# Patient Record
Sex: Male | Born: 1955 | Hispanic: No | Marital: Married | Smoking: Current every day smoker
Health system: Southern US, Community
[De-identification: ages and names within clinical notes are randomized; demographics above are authoritative.]

## PROBLEM LIST (undated history)

## (undated) DIAGNOSIS — E119 Type 2 diabetes mellitus without complications: Secondary | ICD-10-CM

## (undated) DIAGNOSIS — E7800 Pure hypercholesterolemia, unspecified: Secondary | ICD-10-CM

## (undated) HISTORY — PX: RETINAL DETACHMENT SURGERY: SHX105

---

## 2002-10-20 ENCOUNTER — Emergency Department (HOSPITAL_COMMUNITY): Admission: EM | Admit: 2002-10-20 | Discharge: 2002-10-20 | Payer: Self-pay

## 2006-06-06 ENCOUNTER — Emergency Department (HOSPITAL_COMMUNITY): Admission: EM | Admit: 2006-06-06 | Discharge: 2006-06-06 | Payer: Self-pay | Admitting: Emergency Medicine

## 2012-09-18 ENCOUNTER — Emergency Department: Payer: Self-pay | Admitting: Emergency Medicine

## 2012-09-20 ENCOUNTER — Emergency Department: Payer: Self-pay | Admitting: Emergency Medicine

## 2012-09-20 LAB — BASIC METABOLIC PANEL
Anion Gap: 3 — ABNORMAL LOW (ref 7–16)
BUN: 16 mg/dL (ref 7–18)
Calcium, Total: 8.9 mg/dL (ref 8.5–10.1)
Chloride: 108 mmol/L — ABNORMAL HIGH (ref 98–107)
Co2: 30 mmol/L (ref 21–32)
Creatinine: 0.85 mg/dL (ref 0.60–1.30)
EGFR (African American): >60
EGFR (Non-African Amer.): >60
Glucose: 100 mg/dL — ABNORMAL HIGH (ref 65–99)
Osmolality: 283 (ref 275–301)
Potassium: 4.2 mmol/L (ref 3.5–5.1)
Sodium: 141 mmol/L (ref 136–145)

## 2012-09-20 LAB — CBC WITH DIFFERENTIAL/PLATELET
Basophil #: 0.1 10*3/uL (ref 0.0–0.1)
Basophil %: 1 %
Eosinophil #: 0.1 10*3/uL (ref 0.0–0.7)
Eosinophil %: 1.4 %
HCT: 44.1 % (ref 40.0–52.0)
HGB: 15 g/dL (ref 13.0–18.0)
Lymphocyte #: 1.7 10*3/uL (ref 1.0–3.6)
Lymphocyte %: 22.3 %
MCH: 31.9 pg (ref 26.0–34.0)
MCHC: 34 g/dL (ref 32.0–36.0)
MCV: 94 fL (ref 80–100)
Monocyte #: 0.4 x10 3/mm (ref 0.2–1.0)
Monocyte %: 6 %
Neutrophil #: 5.2 10*3/uL (ref 1.4–6.5)
Neutrophil %: 69.3 %
Platelet: 134 10*3/uL — ABNORMAL LOW (ref 150–440)
RBC: 4.7 10*6/uL (ref 4.40–5.90)
RDW: 13.1 % (ref 11.5–14.5)
WBC: 7.5 10*3/uL (ref 3.8–10.6)

## 2012-09-20 LAB — URINALYSIS, COMPLETE
Bacteria: NONE SEEN
Bilirubin,UR: NEGATIVE
Blood: NEGATIVE
Glucose,UR: NEGATIVE mg/dL (ref 0–75)
Ketone: NEGATIVE
Leukocyte Esterase: NEGATIVE
Nitrite: NEGATIVE
Ph: 7 (ref 4.5–8.0)
Protein: NEGATIVE
RBC,UR: 1 /HPF (ref 0–5)
Specific Gravity: 1.006 (ref 1.003–1.030)
Squamous Epithelial: NONE SEEN
WBC UR: <1 /HPF (ref 0–5)

## 2012-12-13 ENCOUNTER — Ambulatory Visit (INDEPENDENT_AMBULATORY_CARE_PROVIDER_SITE_OTHER): Payer: PRIVATE HEALTH INSURANCE | Admitting: Family Medicine

## 2012-12-13 ENCOUNTER — Telehealth: Payer: Self-pay

## 2012-12-13 VITALS — BP 118/71 | HR 64 | Temp 97.8°F | Resp 18 | Ht 68.5 in | Wt 168.2 lb

## 2012-12-13 DIAGNOSIS — IMO0002 Reserved for concepts with insufficient information to code with codable children: Secondary | ICD-10-CM

## 2012-12-13 DIAGNOSIS — H5461 Unqualified visual loss, right eye, normal vision left eye: Secondary | ICD-10-CM

## 2012-12-13 DIAGNOSIS — H0012 Chalazion right lower eyelid: Secondary | ICD-10-CM

## 2012-12-13 DIAGNOSIS — M6740 Ganglion, unspecified site: Secondary | ICD-10-CM

## 2012-12-13 DIAGNOSIS — H5460 Unqualified visual loss, one eye, unspecified: Secondary | ICD-10-CM

## 2012-12-13 DIAGNOSIS — H0019 Chalazion unspecified eye, unspecified eyelid: Secondary | ICD-10-CM

## 2012-12-13 MED ORDER — BACITRACIN-POLYMYXIN B 500-10000 UNIT/GM OP OINT: TOPICAL_OINTMENT | Freq: Four times a day (QID) | OPHTHALMIC | Status: DC

## 2012-12-13 NOTE — Telephone Encounter (Signed)
Pt verifying which pharmacy medication was sent to

## 2012-12-13 NOTE — Progress Notes (Signed)
Subjective:    Patient ID: Greg Russell, male    DOB: 1956-04-09, 57 y.o.   MRN: [id number] Chief Complaint  Patient presents with  . Eye Problem    rt eye has ? cyst  also something on thumb    HPI  Speaks [place] - his son translates.  For the past 2 wks, he has had increasing pain and scratchiness on his right lower eyelid and now a bump is formed there.  Has not drained anything, has not tried anything. Has never had anything similar.  Many yrs ago in [place], he got fiberglass into his right eye and progressively lost his vision. 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 Also, his right iris changed color to blue. He has never seen an eye doctor about this and wonders if there is any chance he could get some of his vision back.  History reviewed. No pertinent past medical history. No current outpatient prescriptions on file prior to visit.   No current facility-administered medications on file prior to visit.   No Known Allergies   Review of Systems  Constitutional: Negative for fever, chills, diaphoresis and fatigue.  HENT: Negative for ear pain, congestion, sore throat, rhinorrhea, sneezing, postnasal drip, sinus pressure and ear discharge.   Eyes: Positive for pain, itching and visual disturbance. Negative for photophobia, discharge and redness.  Skin: Negative for rash.  Hematological: Negative for adenopathy.       BP 118/71  Pulse 64  Temp(Src) 97.8 F (36.6 C) (Oral)  Resp 18  Ht 5' 8.5" (1.74 m)  Wt 168 lb 3.2 oz (76.295 kg)  BMI 25.2 kg/m2  SpO2 98% Objective:   Physical Exam  Constitutional: He is oriented to person, place, and time. He appears well-developed and well-nourished. No distress.  HENT:  Head: Normocephalic and atraumatic.  Eyes: Conjunctivae and EOM are normal. Pupils are equal, round, and reactive to light. Right eye exhibits hordeolum. Right eye exhibits no chemosis, no discharge and no exudate. Right  conjunctiva is not injected. Right conjunctiva has no hemorrhage. No scleral icterus. Right pupil is round and reactive. Left pupil is round and reactive. Pupils are equal.    Right iris silvery blue  Pulmonary/Chest: Effort normal.  Musculoskeletal:       Left hand: He exhibits normal range of motion, no tenderness and no bony tenderness. Normal sensation noted. Normal strength noted.       Hands: Mobile firm well defined subcutaneous nodule sev mm dm near left first IP joint. No restrictions or pain to IP joint.  Neurological: He is alert and oriented to person, place, and time.  Skin: Skin is warm and dry. He is not diaphoretic.  Psychiatric: He has a normal mood and affect. His behavior is normal.      Assessment & Plan:  Chalazion of right lower eyelid - warm compresses followed by top abx ointment sev times daily until resolved.  Vision loss of right eye - Plan: Ambulatory referral to Ophthalmology - Pt informed that due to extended period since intial injury, I doubt that these is any chance that he will be able to regain vision.  However, this makes it even more important that he retain his vision in his left eye and due to his age he is long overdue for eye eval so will refer to optho.  Cyst in hand - on left first IP joint - benign - watchful waiting for now.  Meds ordered this  encounter  Medications  . bacitracin-polymyxin b (POLYSPORIN) ophthalmic ointment    Sig: Place into the right eye 4 (four) times daily. apply to eye every 12 hours while awake    Dispense:  3.5 g    Refill:  0

## 2012-12-13 NOTE — Patient Instructions (Addendum)
Chalazion A chalazion is a swelling or hard lump on the eyelid caused by a blocked oil gland. Chalazions may occur on the upper or the lower eyelid.  CAUSES  Oil gland in the eyelid becomes blocked. SYMPTOMS   Swelling or hard lump on the eyelid. This lump may make it hard to see out of the eye.  The swelling may spread to areas around the eye. TREATMENT   Although some chalazions disappear by themselves in 1 or 2 months, some chalazions may need to be removed.  Medicines to treat an infection may be required. HOME CARE INSTRUCTIONS   Wash your hands often and dry them with a clean towel. Do not touch the chalazion.  Apply heat to the eyelid several times a day for 10 minutes to help ease discomfort and bring any yellowish white fluid (pus) to the surface. One way to apply heat to a chalazion is to use the handle of a metal spoon.  Hold the handle under hot water until it is hot, and then wrap the handle in paper towels so that the heat can come through without burning your skin.  Hold the wrapped handle against the chalazion and reheat the spoon handle as needed.  Apply heat in this fashion for 10 minutes, 4 times per day.  Return to your caregiver to have the pus removed if it does not break (rupture) on its own.  Do not try to remove the pus yourself by squeezing the chalazion or sticking it with a pin or needle.  Only take over-the-counter or prescription medicines for pain, discomfort, or fever as directed by your caregiver. SEEK IMMEDIATE MEDICAL CARE IF:   You have pain in your eye.  Your vision changes.  The chalazion does not go away.  The chalazion becomes painful, red, or swollen, grows larger, or does not start to disappear after 2 weeks. MAKE SURE YOU:   Understand these instructions.  Will watch your condition.  Will get help right away if you are not doing well or get worse. Document Released: 09/06/2000 Document Revised: 12/02/2011 Document Reviewed:  12/25/2009 [facility] Patient Information 2013 [place], [place].

## 2012-12-24 ENCOUNTER — Ambulatory Visit (INDEPENDENT_AMBULATORY_CARE_PROVIDER_SITE_OTHER): Payer: PRIVATE HEALTH INSURANCE | Admitting: Physician Assistant

## 2012-12-24 VITALS — BP 122/69 | HR 76 | Temp 97.6°F | Resp 18 | Ht 62.5 in | Wt 172.4 lb

## 2012-12-24 DIAGNOSIS — K122 Cellulitis and abscess of mouth: Secondary | ICD-10-CM

## 2012-12-24 MED ORDER — TRAMADOL HCL 50 MG PO TABS: ORAL_TABLET | ORAL | Status: DC

## 2012-12-24 MED ORDER — NAPROXEN 500 MG PO TABS: 500.0000 mg | ORAL_TABLET | Freq: Two times a day (BID) | ORAL | Status: DC

## 2012-12-24 MED ORDER — PENICILLIN V POTASSIUM 500 MG PO TABS: 500.0000 mg | ORAL_TABLET | Freq: Three times a day (TID) | ORAL | Status: DC

## 2012-12-24 NOTE — Progress Notes (Signed)
  Subjective:    Patient ID: Greg Russell, male    DOB: [birth year], 57 y.o.   MRN: [id number]  Dental Pain    57 yr old [place]ian male presents with lower tooth pain that started today. He doesn't have a dentist.  His daughter is here translating. No f/c.  The patient thinks he has a crown on the tooth that is hurting and noticed it being loose ~1 month ago. He took 4 ibuprofen about 3 hours ago that has given some relief.  He takes tramadol for a back problem, but he hasn't taken any of that today. The tooth is also sensitive to heat but soothed by cold.  He denies any health problems.  Review of Systems  All other systems reviewed and are negative.      Objective:   Physical Exam  Nursing note and vitals reviewed. Constitutional: He is oriented to person, place, and time. He appears well-developed and well-nourished.  HENT:  Head: Normocephalic and atraumatic.  Mouth/Throat: No oropharyngeal exudate.  Very poor dentition generally.  Bridge present over Left lower back teeth. Tooth #22 lower Left is slightly loose and very tender with manipulation.  There is swelling at the gum and erythema. Appears to have extensive gum disease. No submental or submandibular nodes.  Cardiovascular: Normal rate and regular rhythm.   Pulmonary/Chest: Effort normal and breath sounds normal.  Neurological: He is alert and oriented to person, place, and time.  Skin: Skin is warm and dry.  Psychiatric: He has a normal mood and affect. His behavior is normal.       Assessment & Plan:  Tooth disease, loose tooth with likely infection and extensive gum disease. MUST SEE DENTIST ASAP!!!! Salt water gargles. Meds ordered this encounter  Medications  . DISCONTD: traMADol (ULTRAM) 50 MG tablet    Sig: Take 50 mg by mouth every 6 (six) hours as needed for pain.  . naproxen (NAPROSYN) 500 MG tablet    Sig: Take 1 tablet (500 mg total) by mouth 2 (two) times daily with a meal. X 1week then prn pain   Dispense:  60 tablet    Refill:  0    Order Specific Question:  Supervising Provider    Answer:  DOOLITTLE, ROBERT P [[id number]]  . traMADol (ULTRAM) 50 MG tablet    Sig: 1 every 6 hours in addition to naprosyn if needed for pain    Dispense:  60 tablet    Refill:  0    Order Specific Question:  Supervising Provider    Answer:  DOOLITTLE, ROBERT P [[id number]]  . penicillin v potassium (VEETID) 500 MG tablet    Sig: Take 1 tablet (500 mg total) by mouth 3 (three) times daily.    Dispense:  30 tablet    Refill:  0    Order Specific Question:  Supervising Provider    Answer:  DOOLITTLE, ROBERT P [[id number]]

## 2013-01-15 ENCOUNTER — Ambulatory Visit (INDEPENDENT_AMBULATORY_CARE_PROVIDER_SITE_OTHER): Payer: PRIVATE HEALTH INSURANCE | Admitting: Family Medicine

## 2013-01-15 VITALS — BP 109/72 | HR 76 | Temp 98.5°F | Resp 16 | Ht 68.0 in | Wt 169.0 lb

## 2013-01-15 DIAGNOSIS — L0201 Cutaneous abscess of face: Secondary | ICD-10-CM

## 2013-01-15 DIAGNOSIS — L03211 Cellulitis of face: Secondary | ICD-10-CM

## 2013-01-15 MED ORDER — DOXYCYCLINE HYCLATE 100 MG PO TABS: 100.0000 mg | ORAL_TABLET | Freq: Two times a day (BID) | ORAL | Status: DC

## 2013-01-15 NOTE — Progress Notes (Signed)
57 yo man with right cheek induration and tenderness.    Objective:   Red, indurated 1/2 cm area on right cheek  Assessment:  Cellulitis   Cellulitis and abscess of face - Plan: doxycycline (VIBRA-TABS) 100 MG tablet

## 2013-03-16 ENCOUNTER — Other Ambulatory Visit (HOSPITAL_COMMUNITY): Payer: Self-pay | Admitting: Neurosurgery

## 2013-03-16 DIAGNOSIS — M4306 Spondylolysis, lumbar region: Secondary | ICD-10-CM

## 2013-04-15 ENCOUNTER — Inpatient Hospital Stay (HOSPITAL_COMMUNITY): Admission: RE | Admit: 2013-04-15 | Payer: Self-pay | Source: Ambulatory Visit

## 2013-04-15 ENCOUNTER — Ambulatory Visit (HOSPITAL_COMMUNITY): Payer: Worker's Compensation

## 2013-04-30 ENCOUNTER — Other Ambulatory Visit: Payer: Self-pay | Admitting: Neurosurgery

## 2013-05-03 ENCOUNTER — Other Ambulatory Visit (HOSPITAL_COMMUNITY): Payer: Self-pay | Admitting: Neurosurgery

## 2013-05-03 ENCOUNTER — Encounter (HOSPITAL_COMMUNITY): Payer: Self-pay | Admitting: Pharmacy Technician

## 2013-05-03 ENCOUNTER — Ambulatory Visit (HOSPITAL_COMMUNITY)
Admission: RE | Admit: 2013-05-03 | Discharge: 2013-05-03 | Disposition: A | Discharge status: Home / Self Care | Payer: Worker's Compensation | Source: Ambulatory Visit | Attending: Neurosurgery | Admitting: Neurosurgery

## 2013-05-03 ENCOUNTER — Encounter (HOSPITAL_COMMUNITY): Payer: Self-pay

## 2013-05-03 DIAGNOSIS — M5137 Other intervertebral disc degeneration, lumbosacral region: Secondary | ICD-10-CM | POA: Insufficient documentation

## 2013-05-03 DIAGNOSIS — M5020 Other cervical disc displacement, unspecified cervical region: Secondary | ICD-10-CM | POA: Insufficient documentation

## 2013-05-03 DIAGNOSIS — M542 Cervicalgia: Secondary | ICD-10-CM | POA: Insufficient documentation

## 2013-05-03 DIAGNOSIS — M4306 Spondylolysis, lumbar region: Secondary | ICD-10-CM

## 2013-05-03 DIAGNOSIS — R52 Pain, unspecified: Secondary | ICD-10-CM

## 2013-05-03 DIAGNOSIS — M5030 Other cervical disc degeneration, unspecified cervical region: Secondary | ICD-10-CM | POA: Insufficient documentation

## 2013-05-03 DIAGNOSIS — M79609 Pain in unspecified limb: Secondary | ICD-10-CM | POA: Insufficient documentation

## 2013-05-03 DIAGNOSIS — M545 Low back pain, unspecified: Secondary | ICD-10-CM | POA: Insufficient documentation

## 2013-05-03 DIAGNOSIS — M51379 Other intervertebral disc degeneration, lumbosacral region without mention of lumbar back pain or lower extremity pain: Secondary | ICD-10-CM | POA: Insufficient documentation

## 2013-05-03 DIAGNOSIS — M47817 Spondylosis without myelopathy or radiculopathy, lumbosacral region: Secondary | ICD-10-CM | POA: Insufficient documentation

## 2013-05-03 MED ORDER — OXYCODONE HCL 5 MG PO TABS
5.0000 mg | ORAL_TABLET | ORAL | Status: DC | PRN
  Administered 2013-05-03: 10 mg via ORAL

## 2013-05-03 MED ORDER — IOHEXOL 180 MG/ML  SOLN: 20.0000 mL | Freq: Once | INTRAMUSCULAR | Status: DC | PRN

## 2013-05-03 MED ORDER — ONDANSETRON HCL 4 MG/2ML IJ SOLN: 4.0000 mg | Freq: Four times a day (QID) | INTRAMUSCULAR | Status: DC | PRN

## 2013-05-03 MED ORDER — DIAZEPAM 5 MG PO TABS
ORAL_TABLET | ORAL | Status: AC
  Administered 2013-05-03: 10 mg
  Filled 2013-05-03: qty 2

## 2013-05-03 MED ORDER — OXYCODONE HCL 5 MG PO TABS
ORAL_TABLET | ORAL | Status: AC
  Filled 2013-05-03: qty 2

## 2013-05-03 MED ORDER — DIAZEPAM 5 MG PO TABS: 10.0000 mg | ORAL_TABLET | Freq: Once | ORAL | Status: DC

## 2013-05-03 MED ORDER — IOHEXOL 300 MG/ML  SOLN
10.0000 mL | Freq: Once | INTRAMUSCULAR | Status: AC | PRN
  Administered 2013-05-03: 10 mL via INTRATHECAL

## 2013-05-03 NOTE — Op Note (Signed)
*   No surgery found * Lumbar Myelogram  PATIENT:  Greg Russell is a 57 y.o. male with pain in the lower back, and right upper extremity pain.  PRE-OPERATIVE DIAGNOSIS:  Lumbago, cervicalgia  POST-OPERATIVE DIAGNOSIS:  Lumbago, cervicalgia  PROCEDURE:  Lumbar Myelogram  SURGEON:  Dwyane Dupree  ANESTHESIA:   local LOCAL MEDICATIONS USED:  LIDOCAINE  and Amount: 6cc ml Procedure Note: With fluoroscopic guidance I placed a needle into the disc space at L3/4. I infiltrated 10cc 300 omnipaque into the thecal sac. This was done without difficulty.   PATIENT DISPOSITION:  PACU - hemodynamically stable.

## 2015-09-29 DIAGNOSIS — M545 Low back pain: Secondary | ICD-10-CM | POA: Diagnosis not present

## 2015-09-29 DIAGNOSIS — H3320 Serous retinal detachment, unspecified eye: Secondary | ICD-10-CM | POA: Diagnosis not present

## 2015-09-29 DIAGNOSIS — H269 Unspecified cataract: Secondary | ICD-10-CM | POA: Diagnosis not present

## 2015-09-29 DIAGNOSIS — Z0000 Encounter for general adult medical examination without abnormal findings: Secondary | ICD-10-CM | POA: Diagnosis not present

## 2015-10-03 DIAGNOSIS — F17210 Nicotine dependence, cigarettes, uncomplicated: Secondary | ICD-10-CM | POA: Diagnosis not present

## 2015-10-03 DIAGNOSIS — E782 Mixed hyperlipidemia: Secondary | ICD-10-CM | POA: Diagnosis not present

## 2015-10-03 DIAGNOSIS — F17200 Nicotine dependence, unspecified, uncomplicated: Secondary | ICD-10-CM | POA: Diagnosis not present

## 2015-10-03 DIAGNOSIS — Z0000 Encounter for general adult medical examination without abnormal findings: Secondary | ICD-10-CM | POA: Diagnosis not present

## 2015-10-23 DIAGNOSIS — E6609 Other obesity due to excess calories: Secondary | ICD-10-CM | POA: Diagnosis not present

## 2015-10-23 DIAGNOSIS — M545 Low back pain: Secondary | ICD-10-CM | POA: Diagnosis not present

## 2015-10-23 DIAGNOSIS — Z1211 Encounter for screening for malignant neoplasm of colon: Secondary | ICD-10-CM | POA: Diagnosis not present

## 2015-11-09 DIAGNOSIS — D120 Benign neoplasm of cecum: Secondary | ICD-10-CM | POA: Diagnosis not present

## 2015-11-09 DIAGNOSIS — K635 Polyp of colon: Secondary | ICD-10-CM | POA: Diagnosis not present

## 2015-11-09 DIAGNOSIS — Z1211 Encounter for screening for malignant neoplasm of colon: Secondary | ICD-10-CM | POA: Diagnosis not present

## 2015-11-28 DIAGNOSIS — E782 Mixed hyperlipidemia: Secondary | ICD-10-CM | POA: Diagnosis not present

## 2015-12-05 DIAGNOSIS — E782 Mixed hyperlipidemia: Secondary | ICD-10-CM | POA: Diagnosis not present

## 2015-12-05 DIAGNOSIS — M545 Low back pain: Secondary | ICD-10-CM | POA: Diagnosis not present

## 2016-01-03 DIAGNOSIS — H1711 Central corneal opacity, right eye: Secondary | ICD-10-CM | POA: Diagnosis not present

## 2016-01-03 DIAGNOSIS — H2701 Aphakia, right eye: Secondary | ICD-10-CM | POA: Diagnosis not present

## 2016-01-03 DIAGNOSIS — T85398D Other mechanical complication of other ocular prosthetic devices, implants and grafts, subsequent encounter: Secondary | ICD-10-CM | POA: Diagnosis not present

## 2016-01-03 DIAGNOSIS — H18231 Secondary corneal edema, right eye: Secondary | ICD-10-CM | POA: Diagnosis not present

## 2016-09-04 DIAGNOSIS — H25012 Cortical age-related cataract, left eye: Secondary | ICD-10-CM | POA: Diagnosis not present

## 2016-09-04 DIAGNOSIS — H54414A Blindness right eye category 4, normal vision left eye: Secondary | ICD-10-CM | POA: Diagnosis not present

## 2016-09-04 DIAGNOSIS — H35362 Drusen (degenerative) of macula, left eye: Secondary | ICD-10-CM | POA: Diagnosis not present

## 2016-09-04 DIAGNOSIS — H338 Other retinal detachments: Secondary | ICD-10-CM | POA: Diagnosis not present

## 2016-09-04 DIAGNOSIS — H2701 Aphakia, right eye: Secondary | ICD-10-CM | POA: Diagnosis not present

## 2016-10-04 DIAGNOSIS — H179 Unspecified corneal scar and opacity: Secondary | ICD-10-CM | POA: Diagnosis not present

## 2016-10-04 DIAGNOSIS — H338 Other retinal detachments: Secondary | ICD-10-CM | POA: Diagnosis not present

## 2016-10-04 DIAGNOSIS — H2701 Aphakia, right eye: Secondary | ICD-10-CM | POA: Diagnosis not present

## 2016-10-04 DIAGNOSIS — H18231 Secondary corneal edema, right eye: Secondary | ICD-10-CM | POA: Diagnosis not present

## 2016-10-04 DIAGNOSIS — H25012 Cortical age-related cataract, left eye: Secondary | ICD-10-CM | POA: Diagnosis not present

## 2016-10-16 DIAGNOSIS — E782 Mixed hyperlipidemia: Secondary | ICD-10-CM | POA: Diagnosis not present

## 2016-10-16 DIAGNOSIS — M545 Low back pain: Secondary | ICD-10-CM | POA: Diagnosis not present

## 2016-10-23 DIAGNOSIS — R5383 Other fatigue: Secondary | ICD-10-CM | POA: Diagnosis not present

## 2016-10-23 DIAGNOSIS — E782 Mixed hyperlipidemia: Secondary | ICD-10-CM | POA: Diagnosis not present

## 2016-10-23 DIAGNOSIS — M545 Low back pain: Secondary | ICD-10-CM | POA: Diagnosis not present

## 2016-10-31 DIAGNOSIS — H2701 Aphakia, right eye: Secondary | ICD-10-CM | POA: Diagnosis not present

## 2016-10-31 DIAGNOSIS — H168 Other keratitis: Secondary | ICD-10-CM | POA: Diagnosis not present

## 2016-10-31 DIAGNOSIS — H18231 Secondary corneal edema, right eye: Secondary | ICD-10-CM | POA: Diagnosis not present

## 2016-10-31 DIAGNOSIS — H3341 Traction detachment of retina, right eye: Secondary | ICD-10-CM | POA: Diagnosis not present

## 2016-10-31 DIAGNOSIS — F17210 Nicotine dependence, cigarettes, uncomplicated: Secondary | ICD-10-CM | POA: Diagnosis not present

## 2016-10-31 DIAGNOSIS — H59011 Keratopathy (bullous aphakic) following cataract surgery, right eye: Secondary | ICD-10-CM | POA: Diagnosis not present

## 2016-10-31 DIAGNOSIS — H3521 Other non-diabetic proliferative retinopathy, right eye: Secondary | ICD-10-CM | POA: Diagnosis not present

## 2016-10-31 DIAGNOSIS — Z8669 Personal history of other diseases of the nervous system and sense organs: Secondary | ICD-10-CM | POA: Diagnosis not present

## 2016-10-31 DIAGNOSIS — H16421 Pannus (corneal), right eye: Secondary | ICD-10-CM | POA: Diagnosis not present

## 2016-10-31 DIAGNOSIS — H3321 Serous retinal detachment, right eye: Secondary | ICD-10-CM | POA: Diagnosis not present

## 2016-11-01 DIAGNOSIS — H2701 Aphakia, right eye: Secondary | ICD-10-CM | POA: Diagnosis not present

## 2016-11-01 DIAGNOSIS — H3341 Traction detachment of retina, right eye: Secondary | ICD-10-CM | POA: Diagnosis not present

## 2016-12-11 DIAGNOSIS — H3521 Other non-diabetic proliferative retinopathy, right eye: Secondary | ICD-10-CM | POA: Diagnosis not present

## 2016-12-11 DIAGNOSIS — H35362 Drusen (degenerative) of macula, left eye: Secondary | ICD-10-CM | POA: Diagnosis not present

## 2016-12-11 DIAGNOSIS — H2701 Aphakia, right eye: Secondary | ICD-10-CM | POA: Diagnosis not present

## 2016-12-11 DIAGNOSIS — H3341 Traction detachment of retina, right eye: Secondary | ICD-10-CM | POA: Diagnosis not present

## 2016-12-11 DIAGNOSIS — H338 Other retinal detachments: Secondary | ICD-10-CM | POA: Diagnosis not present

## 2016-12-24 DIAGNOSIS — E782 Mixed hyperlipidemia: Secondary | ICD-10-CM | POA: Diagnosis not present

## 2016-12-24 DIAGNOSIS — R5383 Other fatigue: Secondary | ICD-10-CM | POA: Diagnosis not present

## 2017-01-01 DIAGNOSIS — E782 Mixed hyperlipidemia: Secondary | ICD-10-CM | POA: Diagnosis not present

## 2017-01-01 DIAGNOSIS — Z87891 Personal history of nicotine dependence: Secondary | ICD-10-CM | POA: Diagnosis not present

## 2017-01-01 DIAGNOSIS — S139XXA Sprain of joints and ligaments of unspecified parts of neck, initial encounter: Secondary | ICD-10-CM | POA: Diagnosis not present

## 2017-01-15 DIAGNOSIS — H338 Other retinal detachments: Secondary | ICD-10-CM | POA: Diagnosis not present

## 2017-01-15 DIAGNOSIS — H2701 Aphakia, right eye: Secondary | ICD-10-CM | POA: Diagnosis not present

## 2017-01-15 DIAGNOSIS — H3341 Traction detachment of retina, right eye: Secondary | ICD-10-CM | POA: Diagnosis not present

## 2017-01-15 DIAGNOSIS — H3521 Other non-diabetic proliferative retinopathy, right eye: Secondary | ICD-10-CM | POA: Diagnosis not present

## 2017-01-15 DIAGNOSIS — H35362 Drusen (degenerative) of macula, left eye: Secondary | ICD-10-CM | POA: Diagnosis not present

## 2017-04-02 DIAGNOSIS — H3521 Other non-diabetic proliferative retinopathy, right eye: Secondary | ICD-10-CM | POA: Diagnosis not present

## 2017-04-02 DIAGNOSIS — Z947 Corneal transplant status: Secondary | ICD-10-CM | POA: Diagnosis not present

## 2017-04-02 DIAGNOSIS — H2701 Aphakia, right eye: Secondary | ICD-10-CM | POA: Diagnosis not present

## 2017-04-02 DIAGNOSIS — H18231 Secondary corneal edema, right eye: Secondary | ICD-10-CM | POA: Diagnosis not present

## 2017-04-02 DIAGNOSIS — H3341 Traction detachment of retina, right eye: Secondary | ICD-10-CM | POA: Diagnosis not present

## 2017-04-10 DIAGNOSIS — Z9889 Other specified postprocedural states: Secondary | ICD-10-CM | POA: Diagnosis not present

## 2017-04-10 DIAGNOSIS — E785 Hyperlipidemia, unspecified: Secondary | ICD-10-CM | POA: Diagnosis not present

## 2017-04-10 DIAGNOSIS — Z947 Corneal transplant status: Secondary | ICD-10-CM | POA: Diagnosis not present

## 2017-04-10 DIAGNOSIS — H33191 Other retinoschisis and retinal cysts, right eye: Secondary | ICD-10-CM | POA: Diagnosis not present

## 2017-04-10 DIAGNOSIS — H3530 Unspecified macular degeneration: Secondary | ICD-10-CM | POA: Diagnosis not present

## 2017-04-10 DIAGNOSIS — H33021 Retinal detachment with multiple breaks, right eye: Secondary | ICD-10-CM | POA: Diagnosis not present

## 2017-04-10 DIAGNOSIS — H31091 Other chorioretinal scars, right eye: Secondary | ICD-10-CM | POA: Diagnosis not present

## 2017-04-10 DIAGNOSIS — H3521 Other non-diabetic proliferative retinopathy, right eye: Secondary | ICD-10-CM | POA: Diagnosis not present

## 2017-04-10 DIAGNOSIS — H35371 Puckering of macula, right eye: Secondary | ICD-10-CM | POA: Diagnosis not present

## 2017-04-10 DIAGNOSIS — F17210 Nicotine dependence, cigarettes, uncomplicated: Secondary | ICD-10-CM | POA: Diagnosis not present

## 2017-04-10 DIAGNOSIS — H2701 Aphakia, right eye: Secondary | ICD-10-CM | POA: Diagnosis not present

## 2017-04-10 DIAGNOSIS — H3341 Traction detachment of retina, right eye: Secondary | ICD-10-CM | POA: Diagnosis not present

## 2017-05-14 DIAGNOSIS — H338 Other retinal detachments: Secondary | ICD-10-CM | POA: Diagnosis not present

## 2017-05-14 DIAGNOSIS — H3521 Other non-diabetic proliferative retinopathy, right eye: Secondary | ICD-10-CM | POA: Diagnosis not present

## 2017-05-14 DIAGNOSIS — H2701 Aphakia, right eye: Secondary | ICD-10-CM | POA: Diagnosis not present

## 2017-05-14 DIAGNOSIS — H35362 Drusen (degenerative) of macula, left eye: Secondary | ICD-10-CM | POA: Diagnosis not present

## 2017-05-14 DIAGNOSIS — H3341 Traction detachment of retina, right eye: Secondary | ICD-10-CM | POA: Diagnosis not present

## 2017-06-04 DIAGNOSIS — Z131 Encounter for screening for diabetes mellitus: Secondary | ICD-10-CM | POA: Diagnosis not present

## 2017-06-04 DIAGNOSIS — E782 Mixed hyperlipidemia: Secondary | ICD-10-CM | POA: Diagnosis not present

## 2017-06-11 DIAGNOSIS — E782 Mixed hyperlipidemia: Secondary | ICD-10-CM | POA: Diagnosis not present

## 2017-07-02 DIAGNOSIS — H3341 Traction detachment of retina, right eye: Secondary | ICD-10-CM | POA: Diagnosis not present

## 2017-08-13 DIAGNOSIS — H35362 Drusen (degenerative) of macula, left eye: Secondary | ICD-10-CM | POA: Diagnosis not present

## 2017-08-13 DIAGNOSIS — H3341 Traction detachment of retina, right eye: Secondary | ICD-10-CM | POA: Diagnosis not present

## 2017-08-13 DIAGNOSIS — Z947 Corneal transplant status: Secondary | ICD-10-CM | POA: Diagnosis not present

## 2017-08-13 DIAGNOSIS — H3521 Other non-diabetic proliferative retinopathy, right eye: Secondary | ICD-10-CM | POA: Diagnosis not present

## 2017-08-13 DIAGNOSIS — H2701 Aphakia, right eye: Secondary | ICD-10-CM | POA: Diagnosis not present

## 2017-08-13 DIAGNOSIS — H179 Unspecified corneal scar and opacity: Secondary | ICD-10-CM | POA: Diagnosis not present

## 2017-08-25 DIAGNOSIS — E782 Mixed hyperlipidemia: Secondary | ICD-10-CM | POA: Diagnosis not present

## 2017-08-27 DIAGNOSIS — E782 Mixed hyperlipidemia: Secondary | ICD-10-CM | POA: Diagnosis not present

## 2017-10-15 DIAGNOSIS — H18231 Secondary corneal edema, right eye: Secondary | ICD-10-CM | POA: Diagnosis not present

## 2017-10-15 DIAGNOSIS — H3521 Other non-diabetic proliferative retinopathy, right eye: Secondary | ICD-10-CM | POA: Diagnosis not present

## 2017-10-15 DIAGNOSIS — Z947 Corneal transplant status: Secondary | ICD-10-CM | POA: Diagnosis not present

## 2018-05-28 DIAGNOSIS — Z947 Corneal transplant status: Secondary | ICD-10-CM | POA: Diagnosis not present

## 2018-05-28 DIAGNOSIS — H5711 Ocular pain, right eye: Secondary | ICD-10-CM | POA: Diagnosis not present

## 2018-06-09 DIAGNOSIS — E782 Mixed hyperlipidemia: Secondary | ICD-10-CM | POA: Diagnosis not present

## 2018-06-09 DIAGNOSIS — R5383 Other fatigue: Secondary | ICD-10-CM | POA: Diagnosis not present

## 2018-06-09 DIAGNOSIS — Z79899 Other long term (current) drug therapy: Secondary | ICD-10-CM | POA: Diagnosis not present

## 2018-06-15 DIAGNOSIS — Z0000 Encounter for general adult medical examination without abnormal findings: Secondary | ICD-10-CM | POA: Diagnosis not present

## 2018-06-15 DIAGNOSIS — R5383 Other fatigue: Secondary | ICD-10-CM | POA: Diagnosis not present

## 2018-06-15 DIAGNOSIS — E782 Mixed hyperlipidemia: Secondary | ICD-10-CM | POA: Diagnosis not present

## 2018-06-15 DIAGNOSIS — Z23 Encounter for immunization: Secondary | ICD-10-CM | POA: Diagnosis not present

## 2018-06-15 DIAGNOSIS — M545 Low back pain: Secondary | ICD-10-CM | POA: Diagnosis not present

## 2018-06-26 DIAGNOSIS — H2701 Aphakia, right eye: Secondary | ICD-10-CM | POA: Diagnosis not present

## 2018-06-26 DIAGNOSIS — H18231 Secondary corneal edema, right eye: Secondary | ICD-10-CM | POA: Diagnosis not present

## 2018-07-29 DIAGNOSIS — H179 Unspecified corneal scar and opacity: Secondary | ICD-10-CM | POA: Diagnosis not present

## 2018-07-29 DIAGNOSIS — H3521 Other non-diabetic proliferative retinopathy, right eye: Secondary | ICD-10-CM | POA: Diagnosis not present

## 2018-07-29 DIAGNOSIS — H3341 Traction detachment of retina, right eye: Secondary | ICD-10-CM | POA: Diagnosis not present

## 2018-07-29 DIAGNOSIS — H5711 Ocular pain, right eye: Secondary | ICD-10-CM | POA: Diagnosis not present

## 2018-07-29 DIAGNOSIS — H2701 Aphakia, right eye: Secondary | ICD-10-CM | POA: Diagnosis not present

## 2018-07-29 DIAGNOSIS — H35362 Drusen (degenerative) of macula, left eye: Secondary | ICD-10-CM | POA: Diagnosis not present

## 2018-07-29 DIAGNOSIS — Z947 Corneal transplant status: Secondary | ICD-10-CM | POA: Diagnosis not present

## 2018-12-10 DIAGNOSIS — R5383 Other fatigue: Secondary | ICD-10-CM | POA: Diagnosis not present

## 2018-12-10 DIAGNOSIS — E782 Mixed hyperlipidemia: Secondary | ICD-10-CM | POA: Diagnosis not present

## 2018-12-10 DIAGNOSIS — Z131 Encounter for screening for diabetes mellitus: Secondary | ICD-10-CM | POA: Diagnosis not present

## 2018-12-10 DIAGNOSIS — Z79899 Other long term (current) drug therapy: Secondary | ICD-10-CM | POA: Diagnosis not present

## 2019-03-12 DIAGNOSIS — E782 Mixed hyperlipidemia: Secondary | ICD-10-CM | POA: Diagnosis not present

## 2019-03-24 DIAGNOSIS — H179 Unspecified corneal scar and opacity: Secondary | ICD-10-CM | POA: Diagnosis not present

## 2019-03-24 DIAGNOSIS — H3521 Other non-diabetic proliferative retinopathy, right eye: Secondary | ICD-10-CM | POA: Diagnosis not present

## 2019-03-24 DIAGNOSIS — H2701 Aphakia, right eye: Secondary | ICD-10-CM | POA: Diagnosis not present

## 2019-03-24 DIAGNOSIS — H3341 Traction detachment of retina, right eye: Secondary | ICD-10-CM | POA: Diagnosis not present

## 2019-03-24 DIAGNOSIS — H5711 Ocular pain, right eye: Secondary | ICD-10-CM | POA: Diagnosis not present

## 2019-03-24 DIAGNOSIS — H18231 Secondary corneal edema, right eye: Secondary | ICD-10-CM | POA: Diagnosis not present

## 2019-03-24 DIAGNOSIS — H35362 Drusen (degenerative) of macula, left eye: Secondary | ICD-10-CM | POA: Diagnosis not present

## 2019-03-24 DIAGNOSIS — Z947 Corneal transplant status: Secondary | ICD-10-CM | POA: Diagnosis not present

## 2019-03-25 DIAGNOSIS — Z7189 Other specified counseling: Secondary | ICD-10-CM | POA: Diagnosis not present

## 2019-03-25 DIAGNOSIS — E782 Mixed hyperlipidemia: Secondary | ICD-10-CM | POA: Diagnosis not present

## 2019-03-25 DIAGNOSIS — R7303 Prediabetes: Secondary | ICD-10-CM | POA: Diagnosis not present

## 2019-03-25 DIAGNOSIS — R5383 Other fatigue: Secondary | ICD-10-CM | POA: Diagnosis not present

## 2019-04-01 ENCOUNTER — Other Ambulatory Visit: Payer: Self-pay

## 2019-04-01 ENCOUNTER — Encounter (HOSPITAL_BASED_OUTPATIENT_CLINIC_OR_DEPARTMENT_OTHER): Payer: Self-pay | Admitting: Emergency Medicine

## 2019-04-01 ENCOUNTER — Emergency Department (HOSPITAL_BASED_OUTPATIENT_CLINIC_OR_DEPARTMENT_OTHER)
Admission: EM | Admit: 2019-04-01 | Discharge: 2019-04-01 | Disposition: A | Discharge status: Home / Self Care | Payer: PPO | Attending: Emergency Medicine | Admitting: Emergency Medicine

## 2019-04-01 DIAGNOSIS — K130 Diseases of lips: Secondary | ICD-10-CM | POA: Insufficient documentation

## 2019-04-01 DIAGNOSIS — E119 Type 2 diabetes mellitus without complications: Secondary | ICD-10-CM | POA: Diagnosis not present

## 2019-04-01 DIAGNOSIS — F17210 Nicotine dependence, cigarettes, uncomplicated: Secondary | ICD-10-CM | POA: Diagnosis not present

## 2019-04-01 DIAGNOSIS — R220 Localized swelling, mass and lump, head: Secondary | ICD-10-CM

## 2019-04-01 HISTORY — DX: Pure hypercholesterolemia, unspecified: E78.00

## 2019-04-01 HISTORY — DX: Type 2 diabetes mellitus without complications: E11.9

## 2019-04-01 LAB — COMPREHENSIVE METABOLIC PANEL
ALT: 19 U/L (ref 0–44)
AST: 20 U/L (ref 15–41)
Albumin: 4.2 g/dL (ref 3.5–5.0)
Alkaline Phosphatase: 64 U/L (ref 38–126)
Anion gap: 8 (ref 5–15)
BUN: 18 mg/dL (ref 8–23)
CO2: 26 mmol/L (ref 22–32)
Calcium: 8.8 mg/dL — ABNORMAL LOW (ref 8.9–10.3)
Chloride: 106 mmol/L (ref 98–111)
Creatinine, Ser: 1.03 mg/dL (ref 0.61–1.24)
GFR calc Af Amer: >60 mL/min (ref >60)
GFR calc non Af Amer: >60 mL/min (ref >60)
Glucose, Bld: 118 mg/dL — ABNORMAL HIGH (ref 70–99)
Potassium: 3.9 mmol/L (ref 3.5–5.1)
Sodium: 140 mmol/L (ref 135–145)
Total Bilirubin: 1.3 mg/dL — ABNORMAL HIGH (ref 0.3–1.2)
Total Protein: 6.8 g/dL (ref 6.5–8.1)

## 2019-04-01 LAB — CBC WITH DIFFERENTIAL/PLATELET
Abs Immature Granulocytes: 0.02 10*3/uL (ref 0.00–0.07)
Basophils Absolute: 0.1 10*3/uL (ref 0.0–0.1)
Basophils Relative: 1 %
Eosinophils Absolute: 0.2 10*3/uL (ref 0.0–0.5)
Eosinophils Relative: 3 %
HCT: 43.8 % (ref 39.0–52.0)
Hemoglobin: 14.2 g/dL (ref 13.0–17.0)
Immature Granulocytes: 0 %
Lymphocytes Relative: 24 %
Lymphs Abs: 1.7 10*3/uL (ref 0.7–4.0)
MCH: 30.5 pg (ref 26.0–34.0)
MCHC: 32.4 g/dL (ref 30.0–36.0)
MCV: 94 fL (ref 80.0–100.0)
Monocytes Absolute: 0.7 10*3/uL (ref 0.1–1.0)
Monocytes Relative: 9 %
Neutro Abs: 4.6 10*3/uL (ref 1.7–7.7)
Neutrophils Relative %: 63 %
Platelets: 160 10*3/uL (ref 150–400)
RBC: 4.66 MIL/uL (ref 4.22–5.81)
RDW: 12.4 % (ref 11.5–15.5)
WBC: 7.3 10*3/uL (ref 4.0–10.5)
nRBC: 0 % (ref 0.0–0.2)

## 2019-04-01 MED ORDER — CEPHALEXIN 500 MG PO CAPS: 500.0000 mg | ORAL_CAPSULE | Freq: Two times a day (BID) | ORAL | 0 refills | Status: DC

## 2019-04-01 NOTE — ED Notes (Signed)
Pt. Daughter at bedside able to translate to Pt. And for the Pt.    RN gave Pt. Daughter discharge instructions and MY Chart information.

## 2019-04-01 NOTE — ED Notes (Signed)
Pt. Lip edema still the same as on arrival with no trouble breathing and no drooling noted.

## 2019-04-01 NOTE — Discharge Instructions (Signed)
May take Benadryl as needed for swelling.  Stop taking the chokeberry supplement.  If symptoms persist despite stopping the supplement after 2 days, start taking the antibiotic.  If you have any worsening of the swelling of the lower lip, swelling inside the mouth, difficulty breathing fever or chills, you need to be evaluated immediately.

## 2019-04-01 NOTE — ED Triage Notes (Signed)
Pt speaks [place]. His daughter is at the bedside. Lower lip redness and swelling x 2 days. Denies pain.

## 2019-04-01 NOTE — ED Notes (Signed)
ED Provider at bedside. 

## 2019-04-01 NOTE — ED Provider Notes (Signed)
[facility] Provider Note   CSN: [id number] Arrival date & time: 04/01/19  1649    History   Chief Complaint Chief Complaint  Patient presents with  . Oral Swelling    HPI Aneesh Matuszak is a 63 y.o. male.     HPI Patient with history of diabetes presents with lower lip swelling which started yesterday.  States there is an irritated feel.  Has no intraoral swelling or difficulty swallowing or breathing.  Daughter states patient started taking "chokeberry"  supplement 3 days ago.  Patient has no other rashes.  No fever or chills.  Does not take any other medication other than "cholesterol pill". Past Medical History:  Diagnosis Date  . Diabetes mellitus without complication ([place])   . High cholesterol     There are no active problems to display for this patient.   Past Surgical History:  Procedure Laterality Date  . RETINAL DETACHMENT SURGERY          Home Medications    Prior to Admission medications   Medication Sig Start Date End Date Taking? Authorizing Provider  cephALEXin (KEFLEX) 500 MG capsule Take 1 capsule (500 mg total) by mouth 2 (two) times daily. 04/01/19   Julianne Rice, MD  PRESCRIPTION MEDICATION Take 1 tablet by mouth 2 (two) times daily. oxycodone    [provider]    Family History No family history on file.  Social History Social History   Tobacco Use  . Smoking status: Current Every Day Smoker    Packs/day: 1.00    Years: 30.00    Pack years: 30.00    Types: Cigarettes  . Smokeless tobacco: Never Used  Substance Use Topics  . Alcohol use: Yes    Comment: once a week  . Drug use: No     Allergies   Patient has no known allergies.   Review of Systems Review of Systems  Constitutional: Negative for chills and fever.  HENT: Positive for facial swelling. Negative for sore throat and trouble swallowing.   Respiratory: Negative for shortness of breath and stridor.   Musculoskeletal:  Negative for myalgias and neck pain.  Skin: Negative for wound.  Neurological: Negative for dizziness, weakness, light-headedness, numbness and headaches.  All other systems reviewed and are negative.    Physical Exam Updated Vital Signs BP (!) 150/79 (BP Location: Left Arm)   Pulse (!) 57   Temp 98.5 F (36.9 C) (Oral)   Resp 16   SpO2 97%   Physical Exam Vitals signs and nursing note reviewed.  Constitutional:      General: He is not in acute distress.    Appearance: Normal appearance. He is well-developed. He is not ill-appearing.  HENT:     Head: Normocephalic and atraumatic.     Mouth/Throat:     Comments: Lower lip swelling with irritation and few areas of honey colored crusts.  No intraoral swelling.  Posterior oropharynx is clear.   Eyes:     Pupils: Pupils are equal, round, and reactive to light.  Neck:     Musculoskeletal: Normal range of motion and neck supple. No neck rigidity or muscular tenderness.  Cardiovascular:     Rate and Rhythm: Normal rate and regular rhythm.  Pulmonary:     Effort: Pulmonary effort is normal.     Breath sounds: Normal breath sounds.  Abdominal:     General: Bowel sounds are normal.     Palpations: Abdomen is soft.     Tenderness:  There is no abdominal tenderness. There is no guarding or rebound.  Musculoskeletal: Normal range of motion.        General: No tenderness.  Lymphadenopathy:     Cervical: No cervical adenopathy.  Skin:    General: Skin is warm and dry.     Findings: No erythema or rash.  Neurological:     Mental Status: He is alert and oriented to person, place, and time.  Psychiatric:        Behavior: Behavior normal.      ED Treatments / Results  Labs (all labs ordered are listed, but only abnormal results are displayed) Labs Reviewed  COMPREHENSIVE METABOLIC PANEL - Abnormal; Notable for the following components:      Result Value   Glucose, Bld 118 (*)    Calcium 8.8 (*)    Total Bilirubin 1.3 (*)     All other components within normal limits  CBC WITH DIFFERENTIAL/PLATELET    EKG None  Radiology No results found.  Procedures Procedures (including critical care time)  Medications Ordered in ED Medications - No data to display   Initial Impression / Assessment and Plan / ED Course  I have reviewed the triage vital signs and the nursing notes.  Pertinent labs & imaging results that were available during my care of the patient were reviewed by me and considered in my medical decision making (see chart for details).        Question allergic reaction versus skin infection such as impetigo.  Will screen with basic labs.  Advised stopping the chokeberry supplement. Patient with normal white blood cell count.  Discussed with patient and daughter.  Though this may be skin infection, think most likely reaction to the supplement.  Patient will stop taking supplement and observe closely using Benadryl as needed for swelling.  If symptoms persist despite stopping the trip.  Will fill prescription for Keflex.  Both patient and patient's daughter understands that if there is any worsening of his symptoms he need to be reevaluated immediately.  Both agree with plan. Final Clinical Impressions(s) / ED Diagnoses   Final diagnoses:  Lip swelling    ED Discharge Orders         Ordered    cephALEXin (KEFLEX) 500 MG capsule  2 times daily     04/01/19 1744           Julianne Rice, MD 04/01/19 1744

## 2019-07-01 DIAGNOSIS — R7303 Prediabetes: Secondary | ICD-10-CM | POA: Diagnosis not present

## 2019-07-01 DIAGNOSIS — Z0000 Encounter for general adult medical examination without abnormal findings: Secondary | ICD-10-CM | POA: Diagnosis not present

## 2019-07-01 DIAGNOSIS — E782 Mixed hyperlipidemia: Secondary | ICD-10-CM | POA: Diagnosis not present

## 2019-07-01 DIAGNOSIS — Z7189 Other specified counseling: Secondary | ICD-10-CM | POA: Diagnosis not present

## 2019-07-01 DIAGNOSIS — Z23 Encounter for immunization: Secondary | ICD-10-CM | POA: Diagnosis not present

## 2019-09-03 DIAGNOSIS — Z23 Encounter for immunization: Secondary | ICD-10-CM | POA: Diagnosis not present

## 2019-11-05 ENCOUNTER — Ambulatory Visit (INDEPENDENT_AMBULATORY_CARE_PROVIDER_SITE_OTHER): Payer: PPO | Admitting: Podiatry

## 2019-11-05 ENCOUNTER — Ambulatory Visit (INDEPENDENT_AMBULATORY_CARE_PROVIDER_SITE_OTHER): Payer: PPO

## 2019-11-05 ENCOUNTER — Other Ambulatory Visit: Payer: Self-pay

## 2019-11-05 ENCOUNTER — Encounter: Payer: Self-pay | Admitting: Podiatry

## 2019-11-05 ENCOUNTER — Other Ambulatory Visit: Payer: Self-pay | Admitting: Podiatry

## 2019-11-05 DIAGNOSIS — M778 Other enthesopathies, not elsewhere classified: Secondary | ICD-10-CM | POA: Diagnosis not present

## 2019-11-05 DIAGNOSIS — M79672 Pain in left foot: Secondary | ICD-10-CM

## 2019-11-05 NOTE — Progress Notes (Signed)
Subjective:  Patient ID: Greg Russell, male    DOB: 07-21-1956,  MRN: [id number]  Chief Complaint  Patient presents with  . Toe Pain    pt is here for a possible fracture of the left second and third toe, pt states that the pain has been going on for about 20 days, pain is elevated when he is applying pressure.    64 y.o. male presents with the above complaint.  Patient presents with possible left second digit capsulitis versus possible third digit capsulitis.  Patient states that the pain has been going for about 20 days.  He hit his foot across the shower which caused him to hurt a light.  There was some ecchymosis present but since then has resolved.  However he still has a pretty good amount of pain to the left forefoot.  Pain is elevated when applying pressure patient states that it feels like is burning.  Patient states the pain scale is 5 out of 10.  Patient is here with his daughter who is a Optometrist.  He denies any other acute complaints.   Review of Systems: Negative except as noted in the HPI. Denies N/V/F/Ch.  Past Medical History:  Diagnosis Date  . Diabetes mellitus without complication ([place])   . High cholesterol     Current Outpatient Medications:  .  atorvastatin (LIPITOR) 40 MG tablet, Take 40 mg by mouth daily., Disp: , Rfl:  .  atropine 1 % ophthalmic solution, , Disp: , Rfl:  .  cephALEXin (KEFLEX) 500 MG capsule, Take 1 capsule (500 mg total) by mouth 2 (two) times daily., Disp: 14 capsule, Rfl: 0 .  prednisoLONE acetate (PRED FORTE) 1 % ophthalmic suspension, , Disp: , Rfl:  .  PRESCRIPTION MEDICATION, Take 1 tablet by mouth 2 (two) times daily. oxycodone, Disp: , Rfl:   Social History   Tobacco Use  Smoking Status Current Every Day Smoker  . Packs/day: 1.00  . Years: 30.00  . Pack years: 30.00  . Types: Cigarettes  Smokeless Tobacco Never Used    No Known Allergies Objective:  There were no vitals filed for this visit. There is no height or weight  on file to calculate BMI. Constitutional Well developed. Well nourished.  Vascular Dorsalis pedis pulses palpable bilaterally. Posterior tibial pulses palpable bilaterally. Capillary refill normal to all digits.  No cyanosis or clubbing noted. Pedal hair growth normal.  Neurologic Normal speech. Oriented to person, place, and time. Epicritic sensation to light touch grossly present bilaterally.  Dermatologic Nails well groomed and normal in appearance. No open wounds. No skin lesions.  Orthopedic:  Pain on palpation of the left second digit metatarsophalangeal joint.  Negative Mulder's click however cannot rule it out neuroma.  Moderate pain with range of motion of the second metatarsal phalangeal joint active and passive.   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  Joint space at the second and third digit appear to be intact without any sign of arthritic changes.  No other bony abnormalities identified. Assessment:   1. Foot pain, left   2. Capsulitis of foot, left    Plan:  Patient was evaluated and treated and all questions answered.  Left second digit capsulitis versus possible neuroma of the second interspace -I explained to the patient the etiology of capsulitis and various treatment options associated with it.  Clinically patient was having pain at the metatarsophalangeal joint with range of motion of the metatarsophalangeal  joint and therefore I believe that this is likely due to capsulitis as opposed to neuroma however I am unable to rule it out due to pain in the interspace as well.  I believe patient will benefit from a steroid injection at the second metatarsophalangeal joint of the left foot.  If patient gets relief from the injection then I believe this was likely capsulitis if not then we will consider neuroma as a differential diagnosis. -A steroid injection was performed at left second  digit using 1% plain Lidocaine and 10 mg of Kenalog. This was well tolerated. -Surgical shoe was dispensed to offload the left second metatarsophalangeal joint.  No follow-ups on file.

## 2019-12-03 ENCOUNTER — Ambulatory Visit: Payer: PPO | Attending: Internal Medicine

## 2019-12-03 ENCOUNTER — Ambulatory Visit: Payer: PPO | Admitting: Podiatry

## 2019-12-03 DIAGNOSIS — Z23 Encounter for immunization: Secondary | ICD-10-CM

## 2019-12-03 NOTE — Progress Notes (Signed)
   Covid-19 Vaccination Clinic  Name:  Greg Russell    MRN: [id number] DOB: 1955-10-14  12/03/2019  Greg Russell was observed post Covid-19 immunization for 15 minutes without incident. He was provided with Vaccine Information Sheet and instruction to access the V-Safe system.   Greg Russell was instructed to call 911 with any severe reactions post vaccine: Marland Kitchen Difficulty breathing  . Swelling of face and throat  . A fast heartbeat  . A bad rash all over body  . Dizziness and weakness   Immunizations Administered    Name Date Dose VIS Date Route   Pfizer COVID-19 Vaccine 12/03/2019 12:19 PM 0.3 mL 09/03/2019 Intramuscular   Manufacturer: [place]   Lot: [id number]   [place]: [id number]

## 2019-12-20 ENCOUNTER — Ambulatory Visit: Payer: PPO | Admitting: Podiatry

## 2019-12-27 ENCOUNTER — Ambulatory Visit: Payer: PPO

## 2019-12-27 ENCOUNTER — Emergency Department (HOSPITAL_BASED_OUTPATIENT_CLINIC_OR_DEPARTMENT_OTHER): Payer: PPO

## 2019-12-27 ENCOUNTER — Encounter (HOSPITAL_BASED_OUTPATIENT_CLINIC_OR_DEPARTMENT_OTHER): Payer: Self-pay | Admitting: *Deleted

## 2019-12-27 ENCOUNTER — Emergency Department (HOSPITAL_BASED_OUTPATIENT_CLINIC_OR_DEPARTMENT_OTHER)
Admission: EM | Admit: 2019-12-27 | Discharge: 2019-12-27 | Disposition: A | Discharge status: Home / Self Care | Payer: PPO | Attending: Emergency Medicine | Admitting: Emergency Medicine

## 2019-12-27 ENCOUNTER — Other Ambulatory Visit: Payer: Self-pay

## 2019-12-27 DIAGNOSIS — E119 Type 2 diabetes mellitus without complications: Secondary | ICD-10-CM | POA: Insufficient documentation

## 2019-12-27 DIAGNOSIS — F17210 Nicotine dependence, cigarettes, uncomplicated: Secondary | ICD-10-CM | POA: Insufficient documentation

## 2019-12-27 DIAGNOSIS — Z79899 Other long term (current) drug therapy: Secondary | ICD-10-CM | POA: Insufficient documentation

## 2019-12-27 DIAGNOSIS — M79672 Pain in left foot: Secondary | ICD-10-CM | POA: Insufficient documentation

## 2019-12-27 DIAGNOSIS — Z23 Encounter for immunization: Secondary | ICD-10-CM

## 2019-12-27 DIAGNOSIS — S62635D Displaced fracture of distal phalanx of left ring finger, subsequent encounter for fracture with routine healing: Secondary | ICD-10-CM | POA: Diagnosis not present

## 2019-12-27 MED ORDER — IBUPROFEN 800 MG PO TABS
800.0000 mg | ORAL_TABLET | Freq: Once | ORAL | Status: AC
  Administered 2019-12-27: 800 mg via ORAL
  Filled 2019-12-27: qty 1

## 2019-12-27 MED ORDER — NAPROXEN 500 MG PO TBEC: 500.0000 mg | DELAYED_RELEASE_TABLET | Freq: Two times a day (BID) | ORAL | 0 refills | Status: AC

## 2019-12-27 MED FILL — NAPROXEN 500 MG TABS: 500 | 10 days supply | Qty: 20 | Fill #0

## 2019-12-27 NOTE — Progress Notes (Signed)
   Covid-19 Vaccination Clinic  Name:  Tymell Gladstone    MRN: [id number] DOB: Dec 11, 1955  12/27/2019  Mr. Jollie was observed post Covid-19 immunization for 15 minutes without incident. He was provided with Vaccine Information Sheet and instruction to access the V-Safe system.   Mr. Abresch was instructed to call 911 with any severe reactions post vaccine: Marland Kitchen Difficulty breathing  . Swelling of face and throat  . A fast heartbeat  . A bad rash all over body  . Dizziness and weakness   Immunizations Administered    Name Date Dose VIS Date Route   Pfizer COVID-19 Vaccine 12/27/2019  2:51 PM 0.3 mL 09/03/2019 Intramuscular   Manufacturer: [place]   Lot: [id number]   [place]: [id number]

## 2019-12-27 NOTE — ED Triage Notes (Addendum)
C/o left foot pain x 2 plus months injury 2 months ago  Has been seen seen for same  States when injury occurred toes were blue  States pain is increased w standing and having increased swelling

## 2019-12-27 NOTE — ED Provider Notes (Signed)
[facility] EMERGENCY DEPARTMENT Provider Note  CSN: [id number] Arrival date & time: 12/27/19 [id number]    History Chief Complaint  Patient presents with  . Foot Pain    HPI  Greg Russell is a 64 y.o. male who presents for complaints of left foot pain.  Patient speaks [place] and his daughter is at bedside to translate.  Patient initially injured his foot in January, getting out of the tub.  He was subsequently seen at a podiatrist office where x-rays were done that showed some mild arthritis but no significant injuries.  He had a cortisone injection without improvement.  He did not follow-up.  He has continued to have pain mostly at the base of his left second toe worse with standing and walking.  Improved with ice.  Has not tried any over-the-counter medications.   Past Medical History:  Diagnosis Date  . Diabetes mellitus without complication ([place])   . High cholesterol     Past Surgical History:  Procedure Laterality Date  . RETINAL DETACHMENT SURGERY      No family history on file.  Social History   Tobacco Use  . Smoking status: Current Every Day Smoker    Packs/day: 1.00    Years: 30.00    Pack years: 30.00    Types: Cigarettes  . Smokeless tobacco: Never Used  Substance Use Topics  . Alcohol use: Yes    Comment: once a week  . Drug use: No     Home Medications Prior to Admission medications   Medication Sig Start Date End Date Taking? Authorizing Provider  atorvastatin (LIPITOR) 40 MG tablet Take 40 mg by mouth daily. 09/13/19   [provider]  atropine 1 % ophthalmic solution  10/20/19   [provider]  naproxen (EC NAPROSYN) 500 MG EC tablet Take 1 tablet (500 mg total) by mouth 2 (two) times daily with a meal for 10 days. 12/27/19 01/06/20  Truddie Hidden, MD  prednisoLONE acetate (PRED FORTE) 1 % ophthalmic suspension  10/20/19   [provider]     Allergies    Patient has no known allergies.   Review of  Systems   Review of Systems  Constitutional: Negative for fever.  HENT: Negative for congestion and sore throat.   Respiratory: Negative for cough and shortness of breath.   Cardiovascular: Negative for chest pain.  Gastrointestinal: Negative for abdominal pain, diarrhea, nausea and vomiting.  Genitourinary: Negative for dysuria.  Musculoskeletal: Positive for arthralgias and joint swelling. Negative for myalgias.  Skin: Negative for rash.  Neurological: Negative for headaches.  Psychiatric/Behavioral: Negative for behavioral problems.     Physical Exam BP 133/82 (BP Location: Right Arm)   Pulse (!) 58   Temp 98.4 F (36.9 C) (Oral)   Resp 16   Ht 5\' 4"  (1.626 m)   SpO2 98%   BMI 27.46 kg/m   Physical Exam HENT:     Head: Normocephalic.     Nose: Nose normal.  Eyes:     Extraocular Movements: Extraocular movements intact.  Pulmonary:     Effort: Pulmonary effort is normal.  Musculoskeletal:        General: Normal range of motion.     Cervical back: Neck supple.     Comments: Tenderness palpation of the base of the left second and third toes, no deformity or bruising.  Pulses are normal.  Skin:    Findings: No rash (on exposed skin).  Neurological:     Mental Status:  He is alert and oriented to person, place, and time.  Psychiatric:        Mood and Affect: Mood normal.      ED Results / Procedures / Treatments   Labs (all labs ordered are listed, but only abnormal results are displayed) Labs Reviewed - No data to display  EKG  None   Radiology DG Foot Complete Left  Result Date: 12/27/2019 CLINICAL DATA:  Pain and swelling. Injury approximately 2 months prior EXAM: LEFT FOOT - COMPLETE 3+ VIEW COMPARISON:  November 05, 2019 FINDINGS: Frontal, oblique, and lateral views were obtained. There is a subtle transverse lucency in the proximal aspect of the fourth distal phalanx, slightly less well seen than on prior study, likely indicating a degree of healing  response in this area. No other evident fracture. No dislocation. Joint spaces appear normal. There is a posterior calcaneal spur. IMPRESSION: Apparent healing fracture of the proximal aspect of the fourth distal phalanx. Alignment anatomic in this area. No other evident fracture. No dislocation. No appreciable joint space narrowing or erosion. Posterior calcaneal spur noted. Electronically Signed   By: Lowella Grip III M.D.   On: 12/27/2019 10:05    Procedures Procedures  Medications Ordered in the ED Medications  ibuprofen (ADVIL) tablet 800 mg (800 mg Oral Given 12/27/19 1019)     ED Course  I have reviewed the triage vital signs and the nursing notes.  Pertinent labs & imaging results that were available during my care of the patient were reviewed by me and considered in my medical decision making (see chart for details).  Clinical Course as of Dec 27 1046  Mon Dec 27, 2019  1045 Reviewed the x-ray images with the patient and the daughter at bedside, including the possibility of an occult fourth DIP fracture.  This is not where he is having pain and is of unclear significance.  Regardless I recommend the patient continue wearing his postop shoe and begin taking nonsteroidal anti-inflammatories for his pain.  He will be encouraged to follow-up with his podiatrist for reassessment.   [CS]    Clinical Course User Index [CS] Truddie Hidden, MD    MDM Rules/Calculators/A&P MDM  Final Clinical Impression(s) / ED Diagnoses Final diagnoses:  Foot pain, left    Rx / DC Orders ED Discharge Orders         Ordered    naproxen (EC NAPROSYN) 500 MG EC tablet  2 times daily with meals     12/27/19 1048           Truddie Hidden, MD 12/27/19 1048

## 2020-01-14 ENCOUNTER — Ambulatory Visit: Payer: PPO | Admitting: Podiatry

## 2020-07-06 DIAGNOSIS — E782 Mixed hyperlipidemia: Secondary | ICD-10-CM | POA: Diagnosis not present

## 2020-07-06 DIAGNOSIS — Z0000 Encounter for general adult medical examination without abnormal findings: Secondary | ICD-10-CM | POA: Diagnosis not present

## 2020-07-13 DIAGNOSIS — Z0000 Encounter for general adult medical examination without abnormal findings: Secondary | ICD-10-CM | POA: Diagnosis not present

## 2020-07-13 DIAGNOSIS — R7303 Prediabetes: Secondary | ICD-10-CM | POA: Diagnosis not present

## 2020-07-13 DIAGNOSIS — E782 Mixed hyperlipidemia: Secondary | ICD-10-CM | POA: Diagnosis not present

## 2020-07-13 DIAGNOSIS — Z23 Encounter for immunization: Secondary | ICD-10-CM | POA: Diagnosis not present

## 2020-07-13 DIAGNOSIS — K2100 Gastro-esophageal reflux disease with esophagitis, without bleeding: Secondary | ICD-10-CM | POA: Diagnosis not present

## 2020-10-03 DIAGNOSIS — Z1152 Encounter for screening for COVID-19: Secondary | ICD-10-CM | POA: Diagnosis not present

## 2020-10-07 IMAGING — CR DG FOOT COMPLETE 3+V*L*
3 series · 3 of 3 positions shown · non-contrast
Comparison: November 05, 2019

CLINICAL DATA: Pain and swelling. Injury approximately 2 months
prior

EXAM:
LEFT FOOT - COMPLETE 3+ VIEW

[t foot ap left]
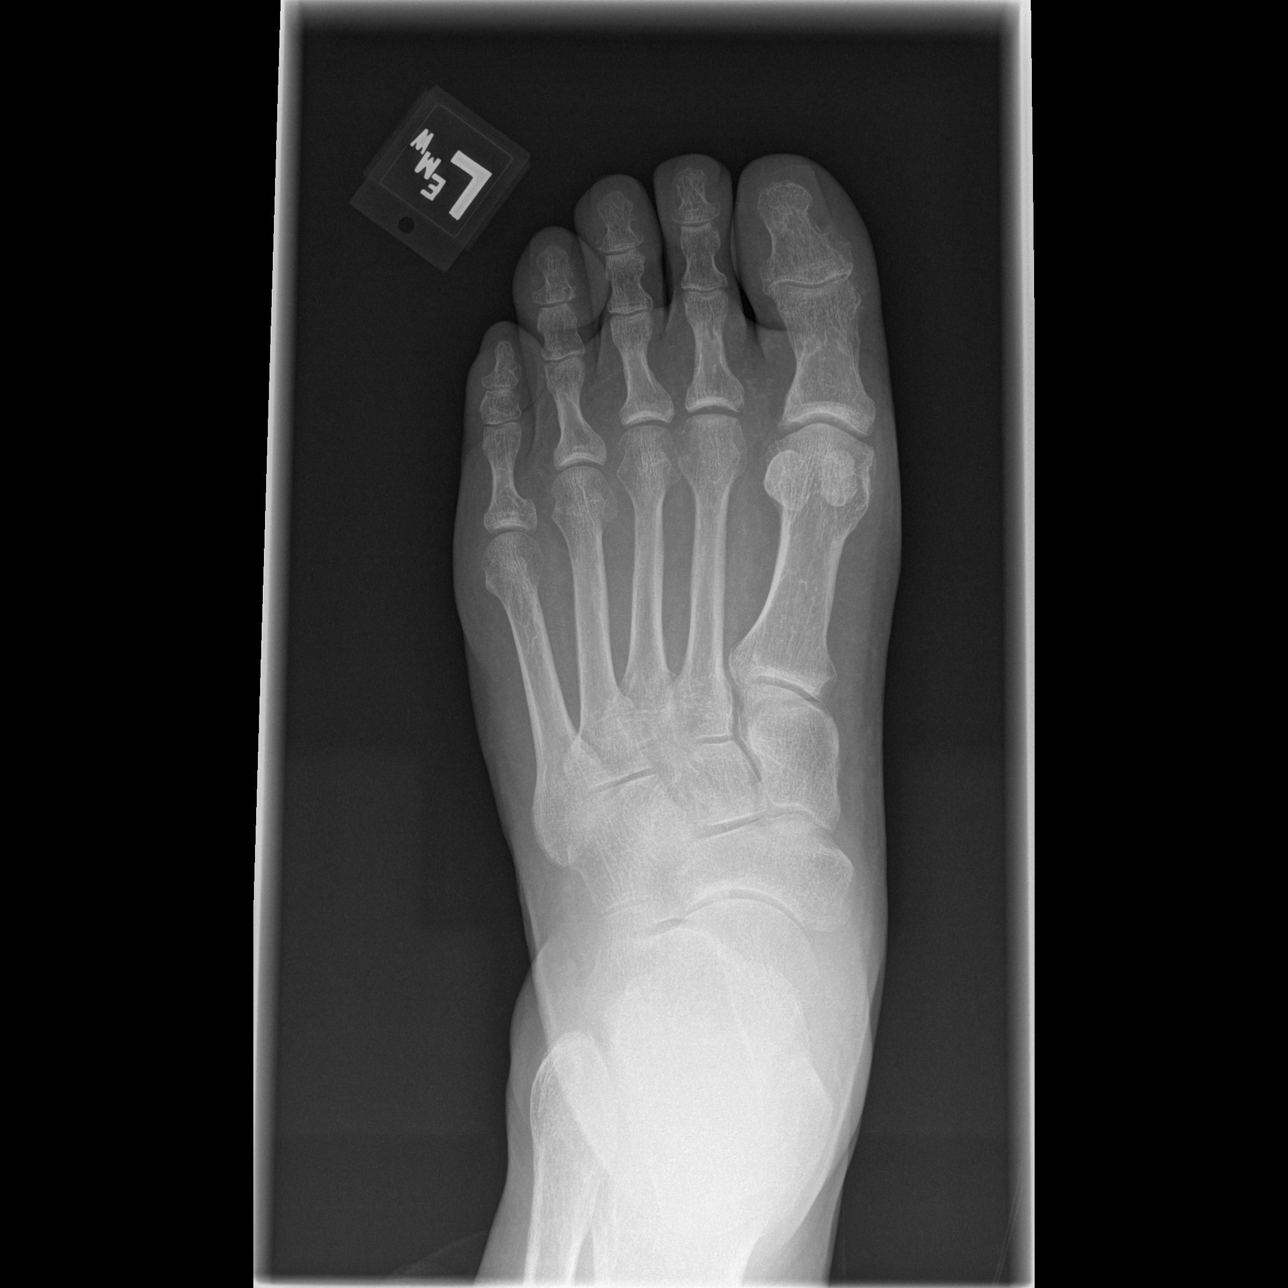

[t foot oblique left]
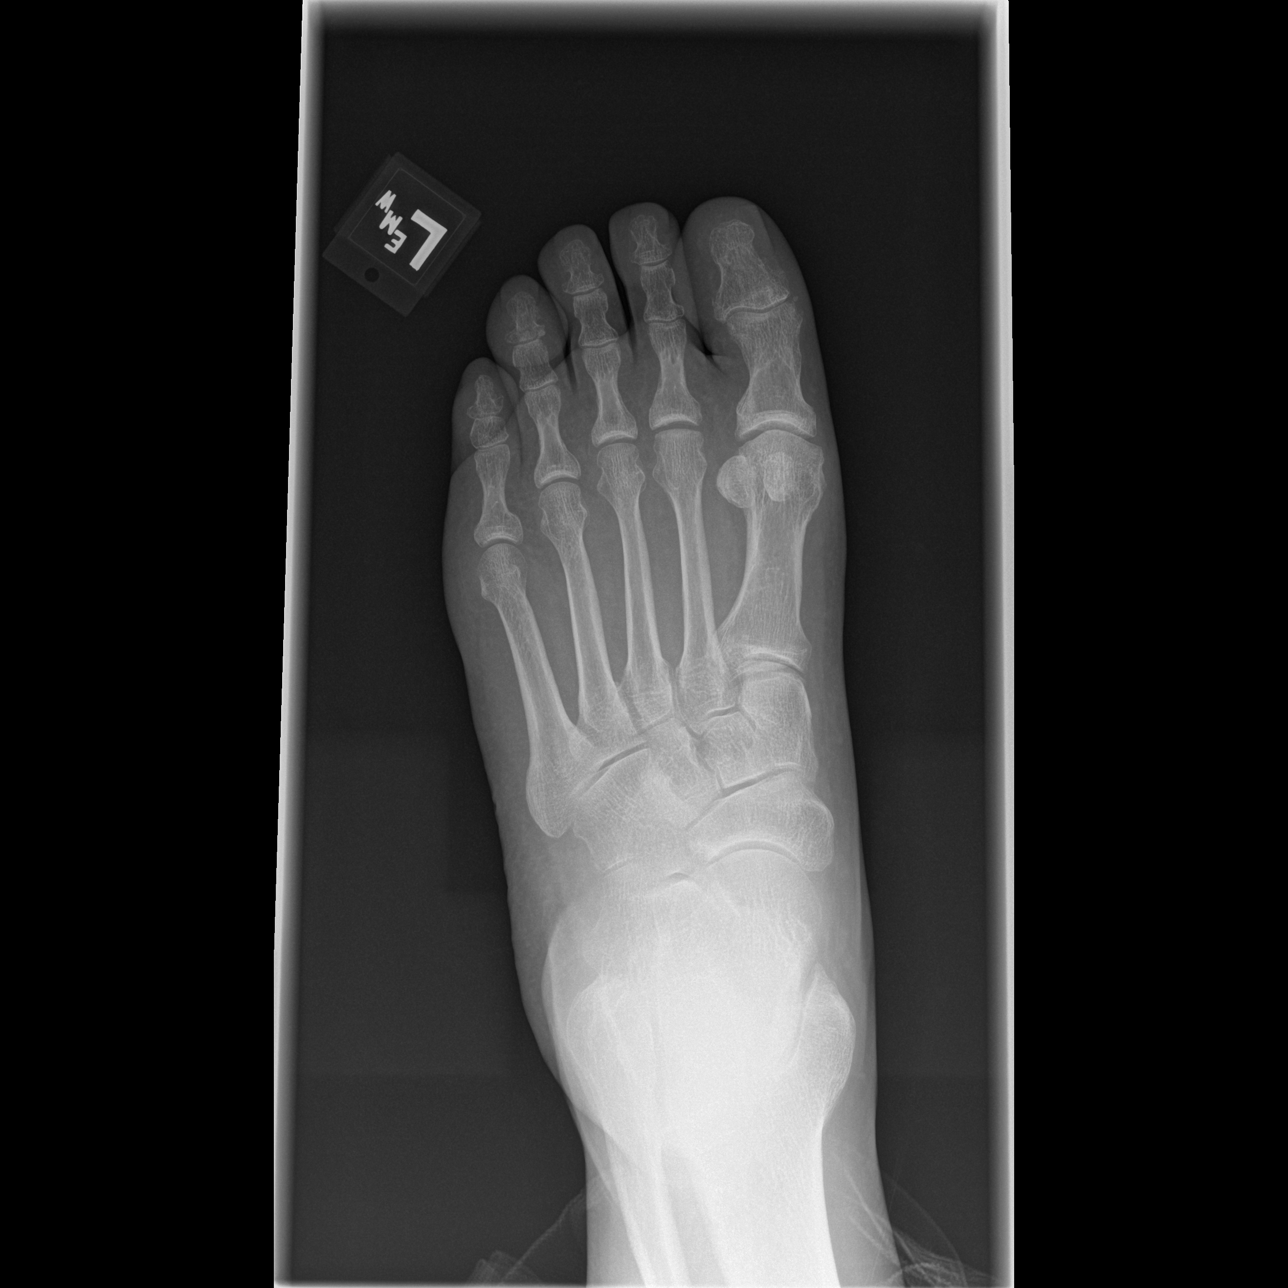

[t foot lat left]
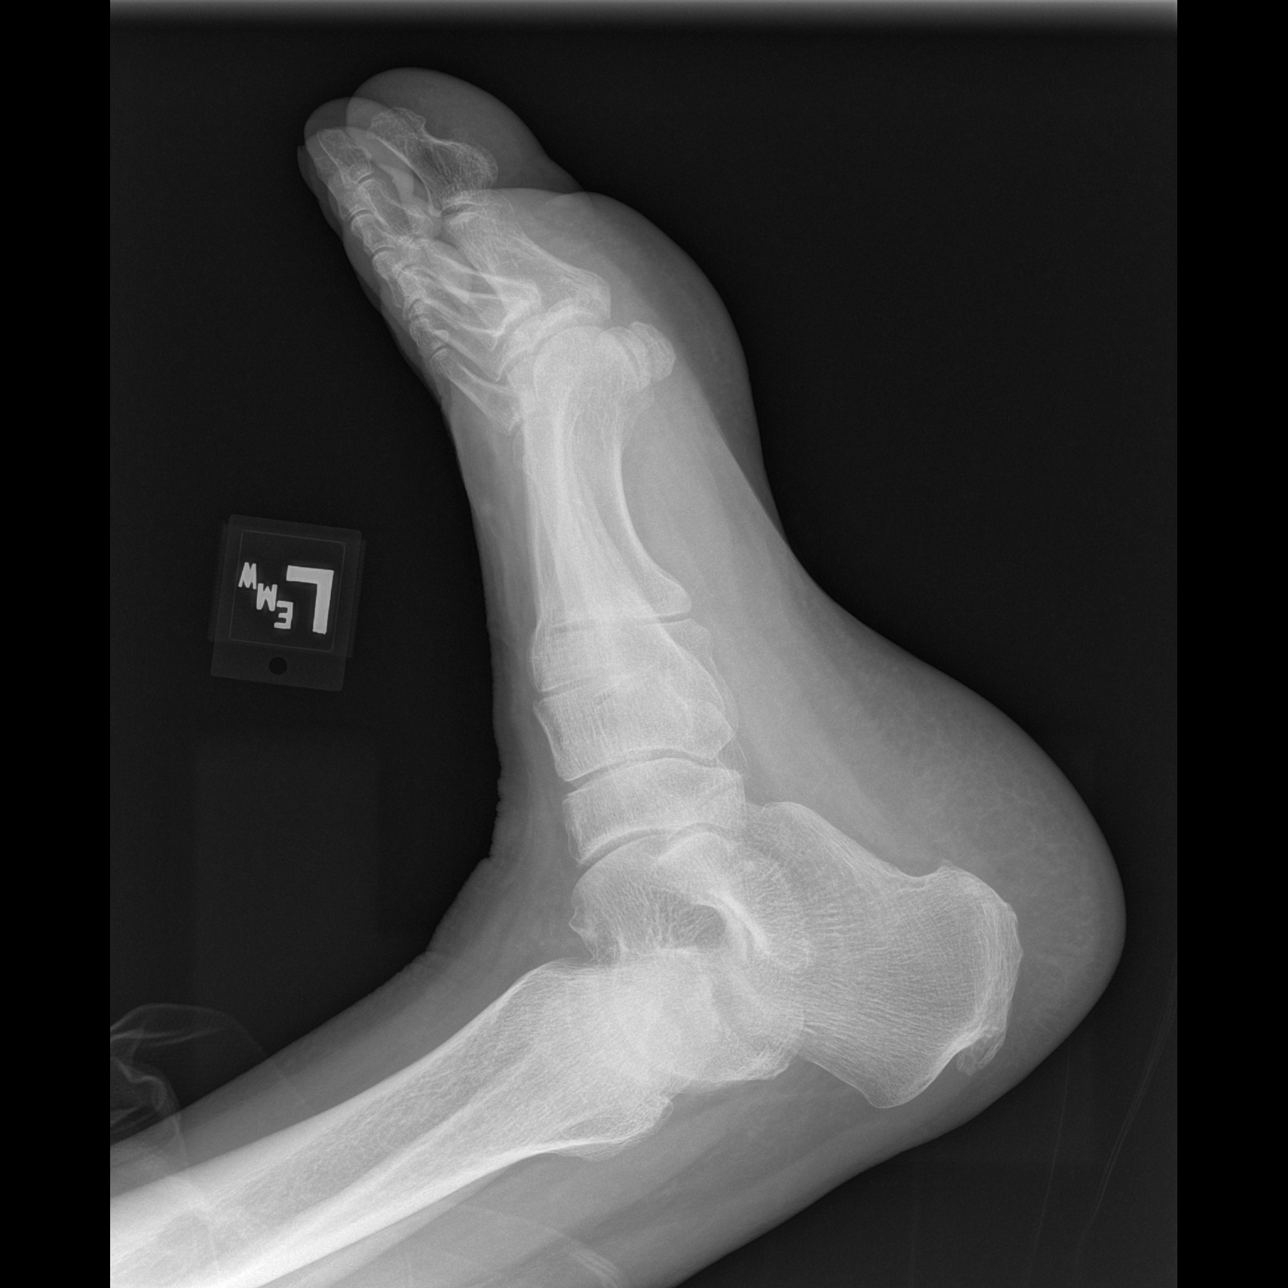

[3 of 3 positions shown; findings below may reference images not displayed]

FINDINGS: Frontal, oblique, and lateral views were obtained. There is a subtle
transverse lucency in the proximal aspect of the fourth distal
phalanx, slightly less well seen than on prior study, likely
indicating a degree of healing response in this area. No other
evident fracture. No dislocation. Joint spaces appear normal. There
is a posterior calcaneal spur.
IMPRESSION: Apparent healing fracture of the proximal aspect of the fourth
distal phalanx. Alignment anatomic in this area. No other evident
fracture. No dislocation. No appreciable joint space narrowing or
erosion. Posterior calcaneal spur noted.

## 2020-12-14 DIAGNOSIS — S335XXA Sprain of ligaments of lumbar spine, initial encounter: Secondary | ICD-10-CM | POA: Diagnosis not present

## 2020-12-14 DIAGNOSIS — X500XXA Overexertion from strenuous movement or load, initial encounter: Secondary | ICD-10-CM | POA: Diagnosis not present

## 2020-12-15 DIAGNOSIS — R5383 Other fatigue: Secondary | ICD-10-CM | POA: Diagnosis not present

## 2020-12-15 DIAGNOSIS — E782 Mixed hyperlipidemia: Secondary | ICD-10-CM | POA: Diagnosis not present

## 2020-12-18 DIAGNOSIS — R7303 Prediabetes: Secondary | ICD-10-CM | POA: Diagnosis not present

## 2020-12-18 DIAGNOSIS — M545 Low back pain, unspecified: Secondary | ICD-10-CM | POA: Diagnosis not present

## 2020-12-18 DIAGNOSIS — R52 Pain, unspecified: Secondary | ICD-10-CM | POA: Diagnosis not present

## 2020-12-18 DIAGNOSIS — E782 Mixed hyperlipidemia: Secondary | ICD-10-CM | POA: Diagnosis not present

## 2020-12-18 DIAGNOSIS — K2100 Gastro-esophageal reflux disease with esophagitis, without bleeding: Secondary | ICD-10-CM | POA: Diagnosis not present

## 2020-12-23 ENCOUNTER — Emergency Department (HOSPITAL_BASED_OUTPATIENT_CLINIC_OR_DEPARTMENT_OTHER)
Admission: EM | Admit: 2020-12-23 | Discharge: 2020-12-23 | Disposition: A | Discharge status: Home / Self Care | Payer: PPO | Attending: Emergency Medicine | Admitting: Emergency Medicine

## 2020-12-23 ENCOUNTER — Emergency Department (HOSPITAL_BASED_OUTPATIENT_CLINIC_OR_DEPARTMENT_OTHER): Payer: PPO

## 2020-12-23 ENCOUNTER — Other Ambulatory Visit: Payer: Self-pay

## 2020-12-23 ENCOUNTER — Encounter (HOSPITAL_BASED_OUTPATIENT_CLINIC_OR_DEPARTMENT_OTHER): Payer: Self-pay | Admitting: Emergency Medicine

## 2020-12-23 DIAGNOSIS — F17210 Nicotine dependence, cigarettes, uncomplicated: Secondary | ICD-10-CM | POA: Insufficient documentation

## 2020-12-23 DIAGNOSIS — E7800 Pure hypercholesterolemia, unspecified: Secondary | ICD-10-CM | POA: Insufficient documentation

## 2020-12-23 DIAGNOSIS — X500XXA Overexertion from strenuous movement or load, initial encounter: Secondary | ICD-10-CM | POA: Diagnosis not present

## 2020-12-23 DIAGNOSIS — Z79899 Other long term (current) drug therapy: Secondary | ICD-10-CM | POA: Diagnosis not present

## 2020-12-23 DIAGNOSIS — M545 Low back pain, unspecified: Secondary | ICD-10-CM

## 2020-12-23 DIAGNOSIS — M5126 Other intervertebral disc displacement, lumbar region: Secondary | ICD-10-CM | POA: Diagnosis not present

## 2020-12-23 DIAGNOSIS — E1169 Type 2 diabetes mellitus with other specified complication: Secondary | ICD-10-CM | POA: Insufficient documentation

## 2020-12-23 MED ORDER — LIDOCAINE 5 % EX PTCH: 1.0000 | MEDICATED_PATCH | CUTANEOUS | 0 refills | Status: DC

## 2020-12-23 MED ORDER — OXYCODONE-ACETAMINOPHEN 5-325 MG PO TABS
1.0000 | ORAL_TABLET | Freq: Once | ORAL | Status: AC
  Administered 2020-12-23: 1 via ORAL
  Filled 2020-12-23: qty 1

## 2020-12-23 MED ORDER — HYDROMORPHONE HCL 1 MG/ML IJ SOLN
1.0000 mg | Freq: Once | INTRAMUSCULAR | Status: AC
  Administered 2020-12-23: 1 mg via INTRAMUSCULAR
  Filled 2020-12-23: qty 1

## 2020-12-23 MED ORDER — METHYLPREDNISOLONE 4 MG PO TBPK: ORAL_TABLET | ORAL | 0 refills | Status: DC

## 2020-12-23 MED ORDER — LIDOCAINE 5 % EX PTCH
1.0000 | MEDICATED_PATCH | CUTANEOUS | Status: DC
  Administered 2020-12-23: 1 via TRANSDERMAL

## 2020-12-23 NOTE — Discharge Instructions (Addendum)
Take Tylenol 1000 mg 4 times a day for 1 week. This is the maximum dose of Tylenol usually take from all sources. Please check other over-the-counter medications and prescriptions to ensure you are not taking other medications that contain acetaminophen.  You may also take ibuprofen 400 mg 6 times a day alternating with or at the same time as tylenol.  Cyclobenzaprine may be taken every 8 hours also as needed for pain.

## 2020-12-23 NOTE — ED Triage Notes (Signed)
Reports left lower back pain that radiates down the left buttock into the leg for the last two weeks.  Given steroid injection and muscle relaxer then started on prednisone Monday by pcp.  Reports pain is getting worse.  Last dose of muscle relaxer was last night and prednisone was this morning.

## 2020-12-23 NOTE — ED Provider Notes (Signed)
[facility] EMERGENCY DEPARTMENT Provider Note   CSN: [id number] Arrival date & time: 12/23/20  1506     History Chief Complaint  Patient presents with  . Back Pain    Greg Russell is a 65 y.o. male.  HPI      Went to urgent care 3 weeks ago, began after lifting Had steroid injection and rx for muscle relaxant Monday had appt with PCP, gave medication for pain, went home with prednisone rx and to continue muscle relaxant at nights and OTC pain medications Getting worse, can't sleep, pain radiating down the left leg Tried heating pads, muscle relaxant/massage Doesn't feel that anything has helped If he stands up and walks, but if he sits that is it Feels that pain radiates around to the abdomen When he stands up or moves around feels like someone is stabbing him with a knife No falls or trauma, no fevers, no nausea, vomiting, diarrhea, constipation, no loss of control of bowel or bladder or urinary symptoms.  Worse with movement   Past Medical History:  Diagnosis Date  . Diabetes mellitus without complication ([place])   . High cholesterol     There are no problems to display for this patient.   Past Surgical History:  Procedure Laterality Date  . RETINAL DETACHMENT SURGERY         No family history on file.  Social History   Tobacco Use  . Smoking status: Current Every Day Smoker    Packs/day: 1.00    Years: 30.00    Pack years: 30.00    Types: Cigarettes  . Smokeless tobacco: Never Used  Substance Use Topics  . Alcohol use: Yes    Comment: once a week  . Drug use: No    Home Medications Prior to Admission medications   Medication Sig Start Date End Date Taking? Authorizing Provider  lidocaine (LIDODERM) 5 % Place 1 patch onto the skin daily. Remove & Discard patch within 12 hours or as directed by MD 12/23/20  Yes Gareth Morgan, MD  methylPREDNISolone (MEDROL DOSEPAK) 4 MG TBPK tablet See instructions on pack 12/23/20  Yes Gareth Morgan,  MD  atorvastatin (LIPITOR) 40 MG tablet Take 40 mg by mouth daily. 09/13/19   [provider]  atropine 1 % ophthalmic solution  10/20/19   [provider]  prednisoLONE acetate (PRED FORTE) 1 % ophthalmic suspension  10/20/19   [provider]    Allergies    Patient has no known allergies.  Review of Systems   Review of Systems  Constitutional: Negative for fever.  HENT: Negative for sore throat.   Eyes: Negative for visual disturbance.  Respiratory: Negative for shortness of breath.   Cardiovascular: Negative for chest pain.  Gastrointestinal: Negative for abdominal pain, nausea and vomiting.  Genitourinary: Negative for difficulty urinating.  Musculoskeletal: Positive for back pain. Negative for neck stiffness.  Skin: Negative for rash.  Neurological: Negative for syncope and headaches.    Physical Exam Updated Vital Signs BP (!) 145/86 (BP Location: Left Arm)   Pulse 64   Temp 98.3 F (36.8 C) (Oral)   Resp 18   Ht 5\' 5"  (1.651 m)   Wt 99.8 kg   SpO2 99%   BMI 36.61 kg/m   Physical Exam Vitals and nursing note reviewed.  Constitutional:      General: He is not in acute distress.    Appearance: He is well-developed. He is not diaphoretic.  HENT:     Head: Normocephalic  and atraumatic.  Eyes:     Conjunctiva/sclera: Conjunctivae normal.  Cardiovascular:     Rate and Rhythm: Normal rate and regular rhythm.     Heart sounds: Normal heart sounds. No murmur heard. No friction rub. No gallop.   Pulmonary:     Effort: Pulmonary effort is normal. No respiratory distress.     Breath sounds: Normal breath sounds. No wheezing or rales.  Abdominal:     General: There is no distension.     Palpations: Abdomen is soft.     Tenderness: There is no abdominal tenderness. There is left CVA tenderness. There is no guarding.  Musculoskeletal:        General: Tenderness (left side of back) present.     Cervical back: Normal range of motion.  Skin:     General: Skin is warm and dry.  Neurological:     Mental Status: He is alert and oriented to person, place, and time.     Sensory: Sensation is intact. No sensory deficit.     Motor: Motor function is intact. No weakness.     ED Results / Procedures / Treatments   Labs (all labs ordered are listed, but only abnormal results are displayed) Labs Reviewed - No data to display  EKG None  Radiology DG Lumbar Spine Complete  Result Date: 12/23/2020 CLINICAL DATA:  65 year old male with left flank pain. EXAM: LUMBAR SPINE - COMPLETE 4+ VIEW COMPARISON:  Lumbar spine CT myelography dated 05/03/2013. FINDINGS: Five lumbar type vertebra. There is no acute fracture or subluxation of the lumbar spine. The bones are osteopenic. There is degenerative changes primarily at L5-S1. Lower lumbar facet arthropathy. The visualized posterior elements are intact. There is atherosclerotic calcification of the abdominal aorta. The soft tissues are unremarkable. IMPRESSION: No acute/traumatic lumbar spine pathology. Electronically Signed   By: Anner Crete M.D.   On: 12/23/2020 16:33    Procedures Procedures   Medications Ordered in ED Medications  oxyCODONE-acetaminophen (PERCOCET/ROXICET) 5-325 MG per tablet 1 tablet (1 tablet Oral Given 12/23/20 1603)  HYDROmorphone (DILAUDID) injection 1 mg (1 mg Intramuscular Given 12/23/20 2044)    ED Course  I have reviewed the triage vital signs and the nursing notes.  Pertinent labs & imaging results that were available during my care of the patient were reviewed by me and considered in my medical decision making (see chart for details).    MDM Rules/Calculators/A&P                          65 year old male with history of diabetes and hypertension presents with concern for left-sided back pain with radiation on the left leg.  The back pain radiating down the left leg began after he had lifted something 3 weeks ago with clinical history most consistent with  likely disc herniation and radicular pain.  Does not have any abdominal tenderness, no nausea or vomiting, no diarrhea or constipation, no fevers, normal bilateral lower extremity pulses, and have low suspicion for acute intra-abdominal etiology of back pain.  History is not consistent with nephrolithiasis, pyelonephritis.  Denies dysuria. No sign of DVT/arterial thrombus/infection.   X-ray of the lumbar back shows no sign of fracture.  Patient has a normal neurologic exam and denies any urinary retention or overflow incontinence, stool incontinence, saddle anesthesia, fever, IV drug use, trauma, chronic steroid use or immunocompromise and have low suspicion suspicion for cauda equina, fracture, epidural abscess, or vertebral osteomyelitis.  Suspect likely disc  herniation which began while lifting 3 weeks ago and associated pain.  Given medrol dose pack and discussed pain control regimen in detail. Recommend close PCP follow up given duration of symptoms. Patient discharged in stable condition with understanding of reasons to return.     Final Clinical Impression(s) / ED Diagnoses Final diagnoses:  Lumbar herniated disc  Lumbar pain    Rx / DC Orders ED Discharge Orders         Ordered    methylPREDNISolone (MEDROL DOSEPAK) 4 MG TBPK tablet        12/23/20 2039    lidocaine (LIDODERM) 5 %  Every 24 hours        12/23/20 2039           Gareth Morgan, MD 12/25/20 [phone]

## 2020-12-23 NOTE — ED Provider Notes (Signed)
Patient seen in triage for MSE  Chief Complaint: Back pain, radiating into left leg  HPI:   Left back pain x 3 weeks, steroid injection, muscle relaxer, oral prednisone without relief. Tingling in left leg, no saddle anesthesia.   ROS: back pain, left leg tingling, inability to walk due to pain NEGATIVE for urinary incontinence or saddle anesthesia.   Physical Exam:   Gen: No distress  Neuro: Awake and Alert  Skin: Warm    Focused Exam: left lumbar TTP, cardiopulmonary exam is normal   Initiation of care has begun. The patient has been counseled on the process, plan, and necessity for staying for the completion/evaluation, and the remainder of the medical screening examination  MSE was initiated and I personally evaluated the patient and placed orders (if any) at  3:54 PM on December 23, 2020.  The patient appears stable so that the remainder of the MSE may be completed by another provider.   Aura Dials 12/23/20 1556    Gareth Morgan, MD 12/25/20 1228

## 2020-12-23 NOTE — ED Notes (Signed)
Pt medicated, lido patch applied per ED Providers orders, VS reassessed prior to DC to home, reviewed AVS with daughter and pt, Opportunity for questions provided, also informed of the two Rx sent their pharmacy listed on chart.

## 2021-07-12 DIAGNOSIS — R7303 Prediabetes: Secondary | ICD-10-CM | POA: Diagnosis not present

## 2021-07-12 DIAGNOSIS — R5383 Other fatigue: Secondary | ICD-10-CM | POA: Diagnosis not present

## 2021-07-12 DIAGNOSIS — E782 Mixed hyperlipidemia: Secondary | ICD-10-CM | POA: Diagnosis not present

## 2021-07-12 DIAGNOSIS — Z125 Encounter for screening for malignant neoplasm of prostate: Secondary | ICD-10-CM | POA: Diagnosis not present

## 2021-07-12 DIAGNOSIS — R39198 Other difficulties with micturition: Secondary | ICD-10-CM | POA: Diagnosis not present

## 2021-07-19 DIAGNOSIS — K2100 Gastro-esophageal reflux disease with esophagitis, without bleeding: Secondary | ICD-10-CM | POA: Diagnosis not present

## 2021-07-19 DIAGNOSIS — E782 Mixed hyperlipidemia: Secondary | ICD-10-CM | POA: Diagnosis not present

## 2021-07-19 DIAGNOSIS — Z0000 Encounter for general adult medical examination without abnormal findings: Secondary | ICD-10-CM | POA: Diagnosis not present

## 2021-07-19 DIAGNOSIS — R7303 Prediabetes: Secondary | ICD-10-CM | POA: Diagnosis not present

## 2021-07-19 DIAGNOSIS — N182 Chronic kidney disease, stage 2 (mild): Secondary | ICD-10-CM | POA: Diagnosis not present

## 2021-07-19 DIAGNOSIS — Z23 Encounter for immunization: Secondary | ICD-10-CM | POA: Diagnosis not present

## 2022-01-24 DIAGNOSIS — R7303 Prediabetes: Secondary | ICD-10-CM | POA: Diagnosis not present

## 2022-01-24 DIAGNOSIS — K2100 Gastro-esophageal reflux disease with esophagitis, without bleeding: Secondary | ICD-10-CM | POA: Diagnosis not present

## 2022-01-24 DIAGNOSIS — E782 Mixed hyperlipidemia: Secondary | ICD-10-CM | POA: Diagnosis not present

## 2022-02-19 DIAGNOSIS — R7303 Prediabetes: Secondary | ICD-10-CM | POA: Diagnosis not present

## 2022-02-19 DIAGNOSIS — E782 Mixed hyperlipidemia: Secondary | ICD-10-CM | POA: Diagnosis not present

## 2022-02-19 DIAGNOSIS — N182 Chronic kidney disease, stage 2 (mild): Secondary | ICD-10-CM | POA: Diagnosis not present

## 2022-02-19 DIAGNOSIS — K2100 Gastro-esophageal reflux disease with esophagitis, without bleeding: Secondary | ICD-10-CM | POA: Diagnosis not present

## 2022-07-26 DIAGNOSIS — N182 Chronic kidney disease, stage 2 (mild): Secondary | ICD-10-CM | POA: Diagnosis not present

## 2022-07-26 DIAGNOSIS — R7303 Prediabetes: Secondary | ICD-10-CM | POA: Diagnosis not present

## 2022-07-26 DIAGNOSIS — Z0000 Encounter for general adult medical examination without abnormal findings: Secondary | ICD-10-CM | POA: Diagnosis not present

## 2022-07-26 DIAGNOSIS — K2100 Gastro-esophageal reflux disease with esophagitis, without bleeding: Secondary | ICD-10-CM | POA: Diagnosis not present

## 2022-07-26 DIAGNOSIS — R5383 Other fatigue: Secondary | ICD-10-CM | POA: Diagnosis not present

## 2022-07-26 DIAGNOSIS — E782 Mixed hyperlipidemia: Secondary | ICD-10-CM | POA: Diagnosis not present

## 2022-07-29 DIAGNOSIS — R5383 Other fatigue: Secondary | ICD-10-CM | POA: Diagnosis not present

## 2022-08-01 DIAGNOSIS — R7303 Prediabetes: Secondary | ICD-10-CM | POA: Diagnosis not present

## 2022-08-01 DIAGNOSIS — K2100 Gastro-esophageal reflux disease with esophagitis, without bleeding: Secondary | ICD-10-CM | POA: Diagnosis not present

## 2022-08-01 DIAGNOSIS — Z0000 Encounter for general adult medical examination without abnormal findings: Secondary | ICD-10-CM | POA: Diagnosis not present

## 2022-08-01 DIAGNOSIS — E782 Mixed hyperlipidemia: Secondary | ICD-10-CM | POA: Diagnosis not present

## 2022-08-01 DIAGNOSIS — N182 Chronic kidney disease, stage 2 (mild): Secondary | ICD-10-CM | POA: Diagnosis not present

## 2022-08-01 DIAGNOSIS — Z23 Encounter for immunization: Secondary | ICD-10-CM | POA: Diagnosis not present

## 2022-08-24 ENCOUNTER — Ambulatory Visit
Admission: EM | Admit: 2022-08-24 | Discharge: 2022-08-24 | Disposition: A | Discharge status: Home / Self Care | Payer: Medicare Other | Attending: Emergency Medicine | Admitting: Emergency Medicine

## 2022-08-24 DIAGNOSIS — Z1152 Encounter for screening for COVID-19: Secondary | ICD-10-CM | POA: Diagnosis not present

## 2022-08-24 DIAGNOSIS — Z20828 Contact with and (suspected) exposure to other viral communicable diseases: Secondary | ICD-10-CM | POA: Insufficient documentation

## 2022-08-24 DIAGNOSIS — R058 Other specified cough: Secondary | ICD-10-CM | POA: Insufficient documentation

## 2022-08-24 DIAGNOSIS — Z20818 Contact with and (suspected) exposure to other bacterial communicable diseases: Secondary | ICD-10-CM | POA: Diagnosis present

## 2022-08-24 DIAGNOSIS — J029 Acute pharyngitis, unspecified: Secondary | ICD-10-CM | POA: Insufficient documentation

## 2022-08-24 DIAGNOSIS — R52 Pain, unspecified: Secondary | ICD-10-CM | POA: Diagnosis not present

## 2022-08-24 LAB — POCT RAPID STREP A (OFFICE): Rapid Strep A Screen: NEGATIVE

## 2022-08-24 NOTE — ED Triage Notes (Signed)
Pt reports sore throat, cough, tonsils pain x 1 day. Advil gives some relief.

## 2022-08-24 NOTE — ED Provider Notes (Signed)
[facility]    CSN: [id number] Arrival date & time: 08/24/22  1149    HISTORY   Chief Complaint  Patient presents with   Sore Throat        HPI Greg Russell is a pleasant, 66 y.o. male who presents to urgent care today. Patient complains of a 1 day history of sore throat, pain in both tonsils and cough.  Patient states has been taking Tylenol with some relief.  Patient is accompanied by his daughter today who states that she works for [employer].  She states he has been exposed to a family members with strep and RSV.  Rapid strep test performed on arrival today was negative.  Daughter is requesting empiric antibiotic treatment for exposure to Streptococcus as well as testing for RSV today.    The history is provided by the patient.   Past Medical History:  Diagnosis Date   Diabetes mellitus without complication ([place])    High cholesterol    There are no problems to display for this patient.  Past Surgical History:  Procedure Laterality Date   RETINAL DETACHMENT SURGERY      Home Medications    Prior to Admission medications   Medication Sig Start Date End Date Taking? Authorizing Provider  ibuprofen (ADVIL) 200 MG tablet Take 200 mg by mouth every 6 (six) hours as needed.   Yes [provider]  atorvastatin (LIPITOR) 40 MG tablet Take 40 mg by mouth daily. 09/13/19   [provider]    Family History History reviewed. No pertinent family history. Social History Social History   Tobacco Use   Smoking status: Every Day    Packs/day: 1.00    Years: 30.00    Total pack years: 30.00    Types: Cigarettes   Smokeless tobacco: Never  Substance Use Topics   Alcohol use: Yes    Comment: once a week   Drug use: No   Allergies   Codeine  Review of Systems Review of Systems Pertinent findings revealed after performing a 14 point review of systems has been noted in the history of present illness.  Physical Exam Triage Vital Signs ED  Triage Vitals  Enc Vitals Group     BP 07/20/21 0827 (!) 147/82     Pulse Rate 07/20/21 0827 72     Resp 07/20/21 0827 18     Temp 07/20/21 0827 98.3 F (36.8 C)     Temp Source 07/20/21 0827 Oral     SpO2 07/20/21 0827 98 %     Weight --      Height --      Head Circumference --      Peak Flow --      Pain Score 07/20/21 0826 5     Pain Loc --      Pain Edu? --      Excl. in [place]? --   No data found.  Updated Vital Signs BP (!) 149/85 (BP Location: Right Arm)   Pulse (!) 57   Temp 98.2 F (36.8 C) (Oral)   Resp 18   SpO2 94%   Physical Exam Vitals and nursing note reviewed.  Constitutional:      Appearance: Normal appearance. He is well-developed and well-groomed. He is ill-appearing.  HENT:     Head: Normocephalic and atraumatic.     Salivary Glands: Right salivary gland is diffusely enlarged and tender. Left salivary gland is diffusely enlarged and tender.     Right Ear: Hearing,  tympanic membrane, ear canal and external ear normal.     Left Ear: Hearing, tympanic membrane, ear canal and external ear normal.     Nose: Nose normal. No congestion or rhinorrhea.     Right Sinus: No maxillary sinus tenderness or frontal sinus tenderness.     Left Sinus: No maxillary sinus tenderness or frontal sinus tenderness.     Mouth/Throat:     Lips: Pink.     Mouth: Mucous membranes are moist.     Pharynx: Uvula midline. Pharyngeal swelling, posterior oropharyngeal erythema and uvula swelling present. No oropharyngeal exudate.     Tonsils: No tonsillar exudate. 0 on the right. 0 on the left.  Eyes:     General: Lids are normal.     Conjunctiva/sclera: Conjunctivae normal.     Right eye: Right conjunctiva is not injected.     Left eye: Left conjunctiva is not injected.     Pupils: Pupils are equal, round, and reactive to light.  Cardiovascular:     Rate and Rhythm: Normal rate and regular rhythm.     Pulses: Normal pulses.     Heart sounds: Normal heart sounds, S1 normal and S2  normal.  Pulmonary:     Effort: Pulmonary effort is normal. No tachypnea, bradypnea, accessory muscle usage, prolonged expiration, respiratory distress or retractions.     Breath sounds: Normal breath sounds and air entry. No stridor. No wheezing, rhonchi or rales.  Abdominal:     General: Abdomen is flat. Bowel sounds are normal.     Palpations: Abdomen is soft.  Musculoskeletal:        General: Normal range of motion.     Cervical back: Full passive range of motion without pain, normal range of motion and neck supple.  Lymphadenopathy:     Cervical: Cervical adenopathy present.     Right cervical: Superficial cervical adenopathy and posterior cervical adenopathy present.     Left cervical: Superficial cervical adenopathy and posterior cervical adenopathy present.  Skin:    General: Skin is warm and dry.  Neurological:     General: No focal deficit present.     Mental Status: He is alert and oriented to person, place, and time.     Motor: Motor function is intact.     Coordination: Coordination is intact.     Gait: Gait is intact.     Deep Tendon Reflexes: Reflexes are normal and symmetric.  Psychiatric:        Attention and Perception: Attention and perception normal.        Mood and Affect: Mood and affect normal.        Speech: Speech normal.        Behavior: Behavior normal. Behavior is cooperative.        Thought Content: Thought content normal.     Visual Acuity Right Eye Distance:   Left Eye Distance:   Bilateral Distance:    Right Eye Near:   Left Eye Near:    Bilateral Near:     [facility] Couse / Diagnostics / Procedures:     Radiology No results found.  Procedures Procedures (including critical care time) EKG  Pending results:  Labs Reviewed  CULTURE, GROUP A STREP ([place])  RESP PANEL BY RT-PCR (RSV, FLU A&B, COVID)  [id number]  POCT RAPID STREP A (OFFICE)    Medications Ordered in UC: Medications - No data to display  UC Diagnoses / Final Clinical  Impressions(s)   I have reviewed the triage vital signs  and the nursing notes.  Pertinent labs & imaging results that were available during my care of the patient were reviewed by me and considered in my medical decision making (see chart for details).    Final diagnoses:  Exposure to Streptococcal pharyngitis  Exposure to respiratory syncytial virus (RSV)  Nonproductive cough  Acute pharyngitis, unspecified etiology  Generalized body aches   Rapid strep test today was negative, throat culture pending, will treat with antibiotics based on results.  Suspect patient's symptoms are viral in etiology, COVID-19 and influenza testing performed, RSV added at daughter's request.  Conservative care recommended, return precautions advised.  Please see discharge instructions below for further details of plan of care.  ED Prescriptions   None    PDMP not reviewed this encounter.  Disposition Upon Discharge:  Condition: stable for discharge home Home: take medications as prescribed; routine discharge instructions as discussed; follow up as advised.  Patient presented with an acute illness with associated systemic symptoms and significant discomfort requiring urgent management. In my opinion, this is a condition that a prudent lay person (someone who possesses an average knowledge of health and medicine) may potentially expect to result in complications if not addressed urgently such as respiratory distress, impairment of bodily function or dysfunction of bodily organs.   Routine symptom specific, illness specific and/or disease specific instructions were discussed with the patient and/or caregiver at length.   As such, the patient has been evaluated and assessed, work-up was performed and treatment was provided in alignment with urgent care protocols and evidence based medicine.  Patient/parent/caregiver has been advised that the patient may require follow up for further testing and treatment if the  symptoms continue in spite of treatment, as clinically indicated and appropriate.  If the patient was tested for COVID-19, Influenza and/or RSV, then the patient/parent/guardian was advised to isolate at home pending the results of his/her diagnostic coronavirus test and potentially longer if they're positive. I have also advised pt that if his/her COVID-19 test returns positive, it's recommended to self-isolate for at least 10 days after symptoms first appeared AND until fever-free for 24 hours without fever reducer AND other symptoms have improved or resolved. Discussed self-isolation recommendations as well as instructions for household member/close contacts as per the [facility] and [place] [facility]HS, and also gave patient the [place] packet with this information.  Patient/parent/caregiver has been advised to return to the [facility] or PCP in 3-5 days if no better; to PCP or the Emergency Department if new signs and symptoms develop, or if the current signs or symptoms continue to change or worsen for further workup, evaluation and treatment as clinically indicated and appropriate  The patient will follow up with their current PCP if and as advised. If the patient does not currently have a PCP we will assist them in obtaining one.   The patient may need specialty follow up if the symptoms continue, in spite of conservative treatment and management, for further workup, evaluation, consultation and treatment as clinically indicated and appropriate.  Patient/parent/caregiver verbalized understanding and agreement of plan as discussed.  All questions were addressed during visit.  Please see discharge instructions below for further details of plan.  Discharge Instructions:   Discharge Instructions      At this time, there are no clinical guidelines supporting prophylactic antibiotics for exposure to streptococcal pharyngitis.  The rapid strep test that we performed in the clinic today was negative.    Physical exam  findings are not concerning for streptococcal pharyngitis  at this time.  We will perform throat culture with the sample obtained during your visit today.  This typically takes 3 to 5 days and if there is a positive result, you will be contacted by phone and antibiotics will be prescribed for you.  The nasal swab that we performed today we will test you for COVID-19, influenza and RSV.  If your COVID-19 test is positive, please discuss antiviral treatment with the nurse who contact you by phone.  If your influenza or RSV test are positive, conservative care is recommended, no antiviral treatment is indicated due to the duration of your symptoms and no antiviral medications available for RSV treatment.  Ibuprofen 400 mg every 8 hours, gargling with warm salt water 3-4 times daily, and throat lozenges are recommended for symptomatic treatment of sore throat symptoms, body ache.  Please return for follow-up if symptoms worsen over the next 5 to 7 days or do not meaningfully improve.  Thank you for visiting urgent care today.      This office note has been dictated using Museum/gallery curator.  Unfortunately, this method of dictation can sometimes lead to typographical or grammatical errors.  I apologize for your inconvenience in advance if this occurs.  Please do not hesitate to reach out to me if clarification is needed.      Lynden Oxford Scales, PA-C 08/25/22 1820

## 2022-08-24 NOTE — Discharge Instructions (Addendum)
At this time, there are no clinical guidelines supporting prophylactic antibiotics for exposure to streptococcal pharyngitis.  The rapid strep test that we performed in the clinic today was negative.    Physical exam findings are not concerning for streptococcal pharyngitis at this time.  We will perform throat culture with the sample obtained during your visit today.  This typically takes 3 to 5 days and if there is a positive result, you will be contacted by phone and antibiotics will be prescribed for you.  The nasal swab that we performed today we will test you for COVID-19, influenza and RSV.  If your COVID-19 test is positive, please discuss antiviral treatment with the nurse who contact you by phone.  If your influenza or RSV test are positive, conservative care is recommended, no antiviral treatment is indicated due to the duration of your symptoms and no antiviral medications available for RSV treatment.  Ibuprofen 400 mg every 8 hours, gargling with warm salt water 3-4 times daily, and throat lozenges are recommended for symptomatic treatment of sore throat symptoms, body ache.  Please return for follow-up if symptoms worsen over the next 5 to 7 days or do not meaningfully improve.  Thank you for visiting urgent care today.

## 2022-08-25 LAB — RESP PANEL BY RT-PCR (RSV, FLU A&B, COVID)  RVPGX2
Influenza A by PCR: NEGATIVE
Influenza B by PCR: NEGATIVE
Resp Syncytial Virus by PCR: NEGATIVE
SARS Coronavirus 2 by RT PCR: NEGATIVE

## 2022-08-27 LAB — CULTURE, GROUP A STREP (THRC)

## 2022-09-30 DIAGNOSIS — Z23 Encounter for immunization: Secondary | ICD-10-CM | POA: Diagnosis not present

## 2022-10-02 DIAGNOSIS — Z86010 Personal history of colonic polyps: Secondary | ICD-10-CM | POA: Diagnosis not present

## 2022-10-29 DIAGNOSIS — H2512 Age-related nuclear cataract, left eye: Secondary | ICD-10-CM | POA: Diagnosis not present

## 2022-10-29 DIAGNOSIS — H2701 Aphakia, right eye: Secondary | ICD-10-CM | POA: Diagnosis not present

## 2022-10-29 DIAGNOSIS — H43812 Vitreous degeneration, left eye: Secondary | ICD-10-CM | POA: Diagnosis not present

## 2022-10-29 DIAGNOSIS — H44521 Atrophy of globe, right eye: Secondary | ICD-10-CM | POA: Diagnosis not present

## 2022-10-29 DIAGNOSIS — H4312 Vitreous hemorrhage, left eye: Secondary | ICD-10-CM | POA: Diagnosis not present

## 2022-10-29 DIAGNOSIS — H43392 Other vitreous opacities, left eye: Secondary | ICD-10-CM | POA: Diagnosis not present

## 2022-10-31 DIAGNOSIS — D127 Benign neoplasm of rectosigmoid junction: Secondary | ICD-10-CM | POA: Diagnosis not present

## 2022-10-31 DIAGNOSIS — K635 Polyp of colon: Secondary | ICD-10-CM | POA: Diagnosis not present

## 2022-10-31 DIAGNOSIS — D122 Benign neoplasm of ascending colon: Secondary | ICD-10-CM | POA: Diagnosis not present

## 2022-10-31 DIAGNOSIS — Z86010 Personal history of colonic polyps: Secondary | ICD-10-CM | POA: Diagnosis not present

## 2022-11-06 DIAGNOSIS — H2701 Aphakia, right eye: Secondary | ICD-10-CM | POA: Diagnosis not present

## 2022-11-06 DIAGNOSIS — H43812 Vitreous degeneration, left eye: Secondary | ICD-10-CM | POA: Diagnosis not present

## 2022-11-06 DIAGNOSIS — H44521 Atrophy of globe, right eye: Secondary | ICD-10-CM | POA: Diagnosis not present

## 2022-11-06 DIAGNOSIS — H43392 Other vitreous opacities, left eye: Secondary | ICD-10-CM | POA: Diagnosis not present

## 2022-11-06 DIAGNOSIS — H2512 Age-related nuclear cataract, left eye: Secondary | ICD-10-CM | POA: Diagnosis not present

## 2022-12-02 DIAGNOSIS — K2100 Gastro-esophageal reflux disease with esophagitis, without bleeding: Secondary | ICD-10-CM | POA: Diagnosis not present

## 2022-12-02 DIAGNOSIS — E782 Mixed hyperlipidemia: Secondary | ICD-10-CM | POA: Diagnosis not present

## 2022-12-02 DIAGNOSIS — R7303 Prediabetes: Secondary | ICD-10-CM | POA: Diagnosis not present

## 2022-12-02 DIAGNOSIS — N182 Chronic kidney disease, stage 2 (mild): Secondary | ICD-10-CM | POA: Diagnosis not present

## 2022-12-11 DIAGNOSIS — H2512 Age-related nuclear cataract, left eye: Secondary | ICD-10-CM | POA: Diagnosis not present

## 2022-12-11 DIAGNOSIS — H43392 Other vitreous opacities, left eye: Secondary | ICD-10-CM | POA: Diagnosis not present

## 2022-12-11 DIAGNOSIS — H44521 Atrophy of globe, right eye: Secondary | ICD-10-CM | POA: Diagnosis not present

## 2022-12-11 DIAGNOSIS — H43812 Vitreous degeneration, left eye: Secondary | ICD-10-CM | POA: Diagnosis not present

## 2022-12-11 DIAGNOSIS — H2701 Aphakia, right eye: Secondary | ICD-10-CM | POA: Diagnosis not present

## 2023-08-20 DIAGNOSIS — R7303 Prediabetes: Secondary | ICD-10-CM | POA: Diagnosis not present

## 2023-08-20 DIAGNOSIS — E782 Mixed hyperlipidemia: Secondary | ICD-10-CM | POA: Diagnosis not present

## 2023-08-20 DIAGNOSIS — N182 Chronic kidney disease, stage 2 (mild): Secondary | ICD-10-CM | POA: Diagnosis not present

## 2023-08-20 DIAGNOSIS — R5383 Other fatigue: Secondary | ICD-10-CM | POA: Diagnosis not present

## 2023-08-20 DIAGNOSIS — K2100 Gastro-esophageal reflux disease with esophagitis, without bleeding: Secondary | ICD-10-CM | POA: Diagnosis not present

## 2023-08-28 DIAGNOSIS — K2100 Gastro-esophageal reflux disease with esophagitis, without bleeding: Secondary | ICD-10-CM | POA: Diagnosis not present

## 2023-08-28 DIAGNOSIS — E782 Mixed hyperlipidemia: Secondary | ICD-10-CM | POA: Diagnosis not present

## 2023-08-28 DIAGNOSIS — N182 Chronic kidney disease, stage 2 (mild): Secondary | ICD-10-CM | POA: Diagnosis not present

## 2023-08-28 DIAGNOSIS — Z0000 Encounter for general adult medical examination without abnormal findings: Secondary | ICD-10-CM | POA: Diagnosis not present

## 2024-02-18 DIAGNOSIS — N182 Chronic kidney disease, stage 2 (mild): Secondary | ICD-10-CM | POA: Diagnosis not present

## 2024-02-18 DIAGNOSIS — K2100 Gastro-esophageal reflux disease with esophagitis, without bleeding: Secondary | ICD-10-CM | POA: Diagnosis not present

## 2024-02-18 DIAGNOSIS — E782 Mixed hyperlipidemia: Secondary | ICD-10-CM | POA: Diagnosis not present

## 2024-02-18 DIAGNOSIS — R7303 Prediabetes: Secondary | ICD-10-CM | POA: Diagnosis not present

## 2024-02-25 DIAGNOSIS — K2100 Gastro-esophageal reflux disease with esophagitis, without bleeding: Secondary | ICD-10-CM | POA: Diagnosis not present

## 2024-02-25 DIAGNOSIS — N182 Chronic kidney disease, stage 2 (mild): Secondary | ICD-10-CM | POA: Diagnosis not present

## 2024-02-25 DIAGNOSIS — E782 Mixed hyperlipidemia: Secondary | ICD-10-CM | POA: Diagnosis not present

## 2024-02-25 DIAGNOSIS — R7303 Prediabetes: Secondary | ICD-10-CM | POA: Diagnosis not present
